# Patient Record
Sex: Female | Born: 1990 | Race: Black or African American | Hispanic: No | Marital: Single | Smoking: Never smoker
Health system: Southern US, Community
[De-identification: ages and names within clinical notes are randomized; demographics above are authoritative.]

## PROBLEM LIST (undated history)

## (undated) DIAGNOSIS — F32A Depression, unspecified: Secondary | ICD-10-CM

## (undated) DIAGNOSIS — J4530 Mild persistent asthma, uncomplicated: Secondary | ICD-10-CM

## (undated) DIAGNOSIS — T783XXA Angioneurotic edema, initial encounter: Secondary | ICD-10-CM

## (undated) HISTORY — DX: Angioneurotic edema, initial encounter: T78.3XXA

## (undated) HISTORY — DX: Mild persistent asthma, uncomplicated: J45.30

## (undated) HISTORY — PX: TONSILLECTOMY: SUR1361

## (undated) HISTORY — DX: Depression, unspecified: F32.A

## (undated) HISTORY — PX: TYMPANOSTOMY TUBE PLACEMENT: SHX32

---

## 2013-05-17 ENCOUNTER — Encounter (HOSPITAL_COMMUNITY): Payer: Self-pay | Admitting: Emergency Medicine

## 2013-05-17 ENCOUNTER — Emergency Department (HOSPITAL_COMMUNITY)
Admission: EM | Admit: 2013-05-17 | Discharge: 2013-05-17 | Discharge status: Against Medical Advice | Payer: BC Managed Care – PPO | Attending: Emergency Medicine | Admitting: Emergency Medicine

## 2013-05-17 DIAGNOSIS — R064 Hyperventilation: Secondary | ICD-10-CM | POA: Insufficient documentation

## 2013-05-17 DIAGNOSIS — S199XXA Unspecified injury of neck, initial encounter: Principal | ICD-10-CM

## 2013-05-17 DIAGNOSIS — Y9389 Activity, other specified: Secondary | ICD-10-CM | POA: Insufficient documentation

## 2013-05-17 DIAGNOSIS — S0993XA Unspecified injury of face, initial encounter: Secondary | ICD-10-CM | POA: Insufficient documentation

## 2013-05-17 DIAGNOSIS — Y92410 Unspecified street and highway as the place of occurrence of the external cause: Secondary | ICD-10-CM | POA: Insufficient documentation

## 2013-05-17 NOTE — ED Notes (Addendum)
Observed pt leaving Emergency room. Encouraged pt and family to stay. Explained to pt and family rooms where available and they were ready. Declined to stay
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
## 2016-09-03 ENCOUNTER — Ambulatory Visit (INDEPENDENT_AMBULATORY_CARE_PROVIDER_SITE_OTHER): Payer: 59 | Admitting: Pediatrics

## 2016-09-03 ENCOUNTER — Encounter: Payer: Self-pay | Admitting: Pediatrics

## 2016-09-03 VITALS — BP 122/76 | HR 87 | Temp 98.9°F | Resp 20 | Ht 67.13 in | Wt 168.0 lb

## 2016-09-03 DIAGNOSIS — J4530 Mild persistent asthma, uncomplicated: Secondary | ICD-10-CM | POA: Diagnosis not present

## 2016-09-03 DIAGNOSIS — T781XXD Other adverse food reactions, not elsewhere classified, subsequent encounter: Secondary | ICD-10-CM | POA: Diagnosis not present

## 2016-09-03 DIAGNOSIS — T7800XA Anaphylactic reaction due to unspecified food, initial encounter: Secondary | ICD-10-CM | POA: Insufficient documentation

## 2016-09-03 DIAGNOSIS — H1010 Acute atopic conjunctivitis, unspecified eye: Secondary | ICD-10-CM | POA: Insufficient documentation

## 2016-09-03 DIAGNOSIS — J301 Allergic rhinitis due to pollen: Secondary | ICD-10-CM | POA: Diagnosis not present

## 2016-09-03 DIAGNOSIS — H1045 Other chronic allergic conjunctivitis: Secondary | ICD-10-CM

## 2016-09-03 DIAGNOSIS — T7800XD Anaphylactic reaction due to unspecified food, subsequent encounter: Secondary | ICD-10-CM

## 2016-09-03 DIAGNOSIS — T781XXA Other adverse food reactions, not elsewhere classified, initial encounter: Secondary | ICD-10-CM | POA: Insufficient documentation

## 2016-09-03 HISTORY — DX: Mild persistent asthma, uncomplicated: J45.30

## 2016-09-03 MED ORDER — EPINEPHRINE 0.3 MG/0.3ML IJ SOAJ: INTRAMUSCULAR | 3 refills | Status: DC

## 2016-09-03 MED ORDER — FLUTICASONE PROPIONATE 50 MCG/ACT NA SUSP: NASAL | 5 refills | Status: DC

## 2016-09-03 MED ORDER — ALBUTEROL SULFATE (2.5 MG/3ML) 0.083% IN NEBU: INHALATION_SOLUTION | RESPIRATORY_TRACT | 2 refills | Status: DC

## 2016-09-03 MED ORDER — ALBUTEROL SULFATE HFA 108 (90 BASE) MCG/ACT IN AERS: INHALATION_SPRAY | RESPIRATORY_TRACT | 2 refills | Status: DC

## 2016-09-03 MED ORDER — MONTELUKAST SODIUM 10 MG PO TABS: ORAL_TABLET | ORAL | 5 refills | Status: DC

## 2016-09-03 NOTE — Patient Instructions (Addendum)
Environmental control of dust Zyrtec 10 mg once or twice a day if needed for runny nose and itchy eyes Fluticasone 2 sprays per nostril once a day for stuffy nose Zaditor 0.025% one drop in each eye 3 times a day to prevent allergies Naphcon-A one drop 3 times a day if needed for itchy eyes Montelukast 10 mg-take 1 tablet once a day for cough or wheeze Ventolin 2 puffs every 4 hours if needed for wheezing or coughing spells or instead albuterol 0.083% one unit dose every 4 hours if needed Add prednisone 20 mg twice a day for 3 days, 20 mg on the fourth day, 10 mg on the fifth day to bring your allergic symptoms under control Call me if you are not doing well on this treatment plan Allergy injections would be beneficial  Avoid peanuts, tree nuts, carrots, celery, apple, kiwi and peach. If you have an allergic reaction take Benadryl 50 mg every 4 hours and if you have life-threatening symptoms inject  with EpiPen 0.3 mg. Then write what you  had to eat or drink in the previous 4 hours

## 2016-09-03 NOTE — Progress Notes (Signed)
[address] [place] [address] Dept: [phone]  New Patient Note  Patient ID: Gina Harrison, female    DOB: [birth year]  Age: 26 y.o. MRN: [id number] Date of Office Visit: 09/03/2016 Referring provider: No referring provider defined for this encounter.    Chief Complaint: Shortness of Breath (wheezing and coughing x 2 months) and Allergic Rhinitis  (sneezing, runny eyes and nose, itchy throat, eyes, inside of mouth and ears)  HPI Aldeen C Little-Faison presents for evaluation of seasonal allergic rhinitis since childhood. Her symptoms are perennial but worse in the springtime. She has aggravation of her symptoms on exposure to dust. She has had itchy eyes. If she she eats carrots, celery's, apples, peaches and kiwi, she has an itchy throat. If she eats large amounts of peanuts or almond she has an itchy throat She has had some coughing wheezing and shortness of breath since childhood. These symptoms are worse with exercise. She has not had chronic hives or eczema  Review of Systems  Constitutional: Negative.   HENT:       Seasonal allergic rhinitis since childhood.-symptoms are perennial. Tonsillectomy  Eyes:       Itch in the spring  Respiratory:       Coughing , wheezing and shortness of breath since childhood. Some exercise-induced shortness of breath  Cardiovascular: Negative.   Gastrointestinal: Negative.   Genitourinary: Negative.   Musculoskeletal: Negative.   Skin:       Itchy rash on the face after eating certain fresh fruits and vegetables  Neurological: Negative.   Endo/Heme/Allergies:       No diabetes or thyroid disease  Psychiatric/Behavioral: Negative.     Outpatient Encounter Prescriptions as of 09/03/2016  Medication Sig  . albuterol (PROVENTIL HFA;VENTOLIN HFA) 108 (90 Base) MCG/ACT inhaler 2 puffs every 4 hours as needed for coughing or wheezing spells  . albuterol (PROVENTIL) (2.5 MG/3ML) 0.083% nebulizer solution One ampule in nebulizer  every 4-6 hours as needed for cough or wheeze  . EPINEPHrine (EPIPEN 2-PAK) 0.3 mg/0.3 mL IJ SOAJ injection Use as directed for severe allergic reactions  . fluticasone (FLONASE) 50 MCG/ACT nasal spray 2 sprays in each nostril once a day for stuffy nose  . montelukast (SINGULAIR) 10 MG tablet One tablet once a day for cough or wheeze   No facility-administered encounter medications on file as of 09/03/2016.      Drug Allergies:  No Known Allergies  Family History: Ashari's family history includes Allergic rhinitis in her maternal grandfather... Family history is negative for asthma, sinus problems, eczema, hives, food allergies, chronic bronchitis or emphysema.  Social and environmental. There are no pets in the home. She is not exposed to cigarette smoking. She does not smoke cigarettes. She is a Pharmacologist  Physical Exam: BP 122/76   Pulse 87   Temp 98.9 F (37.2 C) (Oral)   Resp 20   Ht 5' 7.13" (1.705 m)   Wt 167 lb 15.9 oz (76.2 kg)   SpO2 98%   BMI 26.21 kg/m    Physical Exam  Constitutional: She is oriented to person, place, and time. She appears well-developed and well-nourished.  HENT:  Eyes showed mild erythema of the palpebral conjunctiva. Ears normal. Nose moderate swelling of nasal turbinates with clear nasal discharge. Pharynx normal.  Neck: Neck supple. No thyromegaly present.  Cardiovascular:  S1 and S2 normal no murmurs  Pulmonary/Chest:  Clear to percussion and auscultation  Abdominal: Soft. There is no tenderness (no hepatosplenomegaly).  Lymphadenopathy:  She has no cervical adenopathy.  Neurological: She is alert and oriented to person, place, and time.  Skin:  Clear  Psychiatric: She has a normal mood and affect. Her behavior is normal. Judgment and thought content normal.  Vitals reviewed.   Diagnostics: FVC 3.84 L FEV1 2.83 L. Predicted FVC 3.57 L predicted FEV1 3.04 L. After albuterol 2 puffs FVC 3.98 L FEV1 3.01 L-the spirometry is  in the normal range and there was no significant improvement after albuterol  Allergy skin tests were extremely positive to grass pollens, weeds, tree pollens, molds. Slight reactions noted to cat. She also had positive skin tests to peanut, cashew, apple, peach, carrots, celery, almond, hazelnut   Assessment  Assessment and Plan: 1. Mild persistent asthma without complication   2. Seasonal allergic rhinitis due to pollen   3. Anaphylactic shock due to food, subsequent encounter   4. Pollen-food allergy, subsequent encounter   5. Seasonal allergic conjunctivitis     Meds ordered this encounter  Medications  . fluticasone (FLONASE) 50 MCG/ACT nasal spray    Sig: 2 sprays in each nostril once a day for stuffy nose    Dispense:  16 g    Refill:  5  . montelukast (SINGULAIR) 10 MG tablet    Sig: One tablet once a day for cough or wheeze    Dispense:  34 tablet    Refill:  5  . EPINEPHrine (EPIPEN 2-PAK) 0.3 mg/0.3 mL IJ SOAJ injection    Sig: Use as directed for severe allergic reactions    Dispense:  4 Device    Refill:  3  . albuterol (PROVENTIL) (2.5 MG/3ML) 0.083% nebulizer solution    Sig: One ampule in nebulizer every 4-6 hours as needed for cough or wheeze    Dispense:  75 mL    Refill:  2  . albuterol (PROVENTIL HFA;VENTOLIN HFA) 108 (90 Base) MCG/ACT inhaler    Sig: 2 puffs every 4 hours as needed for coughing or wheezing spells    Dispense:  1 Inhaler    Refill:  2    Patient Instructions  Environmental control of dust Zyrtec 10 mg once or twice a day if needed for runny nose and itchy eyes Fluticasone 2 sprays per nostril once a day for stuffy nose Zaditor 0.025% one drop in each eye 3 times a day to prevent allergies Naphcon-A one drop 3 times a day if needed for itchy eyes Montelukast 10 mg-take 1 tablet once a day for cough or wheeze Ventolin 2 puffs every 4 hours if needed for wheezing or coughing spells or instead albuterol 0.083% one unit dose every 4 hours  if needed Add prednisone 20 mg twice a day for 3 days, 20 mg on the fourth day, 10 mg on the fifth day to bring your allergic symptoms under control Call me if you are not doing well on this treatment plan Allergy injections would be beneficial  Avoid peanuts, tree nuts, carrots, celery, apple, kiwi and peach. If you have an allergic reaction take Benadryl 50 mg every 4 hours and if you have life-threatening symptoms inject  with EpiPen 0.3 mg. Then write what you  had to eat or drink in the previous 4 hours   Return in about 4 weeks (around 10/01/2016).   Thank you for the opportunity to care for this patient.  Please do not hesitate to contact me with questions.  Tonette BihariJ. A. Bardelas, M.D.  [facility] [address]  [place], [place] [address] [phone]

## 2016-10-01 ENCOUNTER — Ambulatory Visit (INDEPENDENT_AMBULATORY_CARE_PROVIDER_SITE_OTHER): Payer: 59 | Admitting: Pediatrics

## 2016-10-01 ENCOUNTER — Encounter: Payer: Self-pay | Admitting: Pediatrics

## 2016-10-01 VITALS — BP 106/64 | HR 78 | Temp 98.7°F | Resp 16

## 2016-10-01 DIAGNOSIS — J301 Allergic rhinitis due to pollen: Secondary | ICD-10-CM

## 2016-10-01 DIAGNOSIS — T781XXD Other adverse food reactions, not elsewhere classified, subsequent encounter: Secondary | ICD-10-CM | POA: Diagnosis not present

## 2016-10-01 DIAGNOSIS — H1045 Other chronic allergic conjunctivitis: Secondary | ICD-10-CM | POA: Diagnosis not present

## 2016-10-01 DIAGNOSIS — J4530 Mild persistent asthma, uncomplicated: Secondary | ICD-10-CM

## 2016-10-01 DIAGNOSIS — T7800XD Anaphylactic reaction due to unspecified food, subsequent encounter: Secondary | ICD-10-CM

## 2016-10-01 DIAGNOSIS — H1010 Acute atopic conjunctivitis, unspecified eye: Secondary | ICD-10-CM

## 2016-10-01 NOTE — Progress Notes (Signed)
  [address] [place] [address] Dept: [phone]  FOLLOW UP NOTE  Patient ID: Gina MemosDysiree C Harrison, female    DOB: 05-15-1990  Age: 26 y.o. MRN: [id number] Date of Office Visit: 10/01/2016  Assessment  Chief Complaint: Allergies  HPI Gina Harrison presents for follow-up of allergic rhinitis and mild asthma. Her symptoms are much improved since the last visit. She is not having to use Ventolin. Her nasal congestion is much improved. She continues to avoid peanuts, tree nuts, carrots, celery, apple, Kiwi and peach.  Current medications are Zyrtec 10 mg once a day, fluticasone 2 sprays per nostril once a day, Zaditor 0.025% one drop in each eye 3 times a day, Naphcon-A one drop 3 times a day if needed, montelukast 10 mg once a day, Ventolin 2 puffs every 4 hours if needed or instead albuterol 0.083% one unit dose every 4 hours if needed and Benadryl and EpiPen in case of an allergic reaction.   Drug Allergies:  Allergies  Allergen Reactions  . Apple   . Carrot Oil   . Celery Oil   . Kiwi Extract   . Peach [Prunus Persica]   . Peanut-Containing Drug Products     Tree nuts    Physical Exam: BP 106/64   Pulse 78   Temp 98.7 F (37.1 C) (Oral)   Resp 16   SpO2 99%    Physical Exam  Constitutional: She is oriented to person, place, and time. She appears well-developed and well-nourished.  HENT:  Eyes normal. Ears normal. Nose normal. Pharynx normal.  Neck: Neck supple.  Cardiovascular:  S1 and S2 normal no murmurs  Pulmonary/Chest:  Clear to percussion and auscultation  Lymphadenopathy:    She has no cervical adenopathy.  Neurological: She is alert and oriented to person, place, and time.  Psychiatric: She has a normal mood and affect. Her behavior is normal. Judgment and thought content normal.  Vitals reviewed.   Diagnostics:  FVC 3.81 L FEV1 2.94 L. Predicted FVC 3.57 L predicted FEV1 3.04 L-the spirometry is in the normal range  Assessment  and Plan: 1. Mild persistent asthma without complication   2. Anaphylactic shock due to food, subsequent encounter   3. Pollen-food allergy, subsequent encounter   4. Seasonal allergic rhinitis due to pollen   5. Seasonal allergic conjunctivitis      Patient Instructions  Continue on her current medications In the end of June she may use Zyrtec, fluticasone and montelukast  if needed. She may be able to get by without  medications until the next allergy season Continue on her diet elimination and having Benadryl and EpiPen 0.3 mg available Call me if you are not doing well on this treatment plan   Return in about 1 year (around 10/01/2017).    Thank you for the opportunity to care for this patient.  Please do not hesitate to contact me with questions.  Tonette BihariJ. A. Ami Mally, M.D.  [facility] [address] [place], [place] [address] [phone]

## 2016-10-01 NOTE — Patient Instructions (Addendum)
Continue on her current medications In the end of June she may use Zyrtec, fluticasone and montelukast  if needed. She may be able to get by without  medications until the next allergy season Continue on her diet elimination and having Benadryl and EpiPen 0.3 mg available Call me if you are not doing well on this treatment plan

## 2017-12-26 ENCOUNTER — Emergency Department (HOSPITAL_BASED_OUTPATIENT_CLINIC_OR_DEPARTMENT_OTHER)
Admission: EM | Admit: 2017-12-26 | Discharge: 2017-12-26 | Disposition: A | Discharge status: Home / Self Care | Payer: 59 | Attending: Emergency Medicine | Admitting: Emergency Medicine

## 2017-12-26 ENCOUNTER — Encounter (HOSPITAL_BASED_OUTPATIENT_CLINIC_OR_DEPARTMENT_OTHER): Payer: Self-pay

## 2017-12-26 ENCOUNTER — Other Ambulatory Visit: Payer: Self-pay

## 2017-12-26 DIAGNOSIS — R51 Headache: Secondary | ICD-10-CM | POA: Insufficient documentation

## 2017-12-26 DIAGNOSIS — Y9389 Activity, other specified: Secondary | ICD-10-CM | POA: Insufficient documentation

## 2017-12-26 DIAGNOSIS — S161XXA Strain of muscle, fascia and tendon at neck level, initial encounter: Secondary | ICD-10-CM

## 2017-12-26 DIAGNOSIS — Z79899 Other long term (current) drug therapy: Secondary | ICD-10-CM | POA: Diagnosis not present

## 2017-12-26 DIAGNOSIS — Y999 Unspecified external cause status: Secondary | ICD-10-CM | POA: Insufficient documentation

## 2017-12-26 DIAGNOSIS — J4530 Mild persistent asthma, uncomplicated: Secondary | ICD-10-CM | POA: Insufficient documentation

## 2017-12-26 DIAGNOSIS — R519 Headache, unspecified: Secondary | ICD-10-CM

## 2017-12-26 DIAGNOSIS — S199XXA Unspecified injury of neck, initial encounter: Secondary | ICD-10-CM | POA: Diagnosis present

## 2017-12-26 DIAGNOSIS — Y92410 Unspecified street and highway as the place of occurrence of the external cause: Secondary | ICD-10-CM | POA: Diagnosis not present

## 2017-12-26 MED ORDER — MELOXICAM 7.5 MG PO TABS: 7.5000 mg | ORAL_TABLET | Freq: Every day | ORAL | 0 refills | Status: DC

## 2017-12-26 MED ORDER — METHOCARBAMOL 500 MG PO TABS: 500.0000 mg | ORAL_TABLET | Freq: Two times a day (BID) | ORAL | 0 refills | Status: DC

## 2017-12-26 MED ORDER — KETOROLAC TROMETHAMINE 30 MG/ML IJ SOLN
30.0000 mg | Freq: Once | INTRAMUSCULAR | Status: AC
  Administered 2017-12-26: 30 mg via INTRAMUSCULAR
  Filled 2017-12-26: qty 1

## 2017-12-26 MED ORDER — METOCLOPRAMIDE HCL 10 MG PO TABS
10.0000 mg | ORAL_TABLET | Freq: Once | ORAL | Status: AC
  Administered 2017-12-26: 10 mg via ORAL
  Filled 2017-12-26: qty 1
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
## 2018-02-13 ENCOUNTER — Ambulatory Visit: Payer: 59

## 2018-02-13 ENCOUNTER — Ambulatory Visit (INDEPENDENT_AMBULATORY_CARE_PROVIDER_SITE_OTHER): Payer: 59

## 2018-02-13 DIAGNOSIS — N912 Amenorrhea, unspecified: Secondary | ICD-10-CM

## 2018-02-13 DIAGNOSIS — Z3201 Encounter for pregnancy test, result positive: Secondary | ICD-10-CM | POA: Diagnosis not present

## 2018-02-13 MED ORDER — VITAFOL GUMMIES 3.33-0.333-34.8 MG PO CHEW: 3.0000 | CHEWABLE_TABLET | Freq: Every day | ORAL | 12 refills | Status: DC

## 2018-02-13 NOTE — Progress Notes (Addendum)
Gina Harrison presents today for UPT. She complains of nausea and vomiting.  LMP: 12/10/17    OBJECTIVE: Appears well, in no apparent distress.  OB History    Gravida  1   Para      Term      Preterm      AB      Living        SAB      TAB      Ectopic      Multiple      Live Births             Home UPT Result: pos In-Office UPT result:pos I have reviewed the patient's medical, obstetrical, social, and family histories, and medications.   ASSESSMENT: Positive pregnancy test  PLAN Prenatal care to be completed at:  [facility]  Pt to start PNV's. Samples of Bonjesta given   I have reviewed the plan of care and agree with the documentation. Nolene Bernheim, RN, MSN, NP-BC Nurse Practitioner, [facility] for [employer], [facility] 02/13/2018 11:43 AM

## 2018-03-23 ENCOUNTER — Other Ambulatory Visit (HOSPITAL_COMMUNITY)
Admission: RE | Admit: 2018-03-23 | Discharge: 2018-03-23 | Disposition: A | Discharge status: Home / Self Care | Payer: 59 | Source: Ambulatory Visit | Attending: Advanced Practice Midwife | Admitting: Advanced Practice Midwife

## 2018-03-23 ENCOUNTER — Ambulatory Visit (INDEPENDENT_AMBULATORY_CARE_PROVIDER_SITE_OTHER): Payer: 59 | Admitting: Advanced Practice Midwife

## 2018-03-23 ENCOUNTER — Encounter: Payer: Self-pay | Admitting: Advanced Practice Midwife

## 2018-03-23 VITALS — BP 119/79 | HR 88 | Wt 185.0 lb

## 2018-03-23 DIAGNOSIS — Z3401 Encounter for supervision of normal first pregnancy, first trimester: Secondary | ICD-10-CM | POA: Insufficient documentation

## 2018-03-23 DIAGNOSIS — Z3481 Encounter for supervision of other normal pregnancy, first trimester: Secondary | ICD-10-CM | POA: Diagnosis not present

## 2018-03-23 DIAGNOSIS — Z3400 Encounter for supervision of normal first pregnancy, unspecified trimester: Secondary | ICD-10-CM | POA: Insufficient documentation

## 2018-03-23 DIAGNOSIS — T7800XA Anaphylactic reaction due to unspecified food, initial encounter: Secondary | ICD-10-CM

## 2018-03-23 DIAGNOSIS — J4530 Mild persistent asthma, uncomplicated: Secondary | ICD-10-CM

## 2018-03-23 MED ORDER — EPINEPHRINE 0.3 MG/0.3ML IJ SOAJ: INTRAMUSCULAR | 1 refills | Status: DC

## 2018-03-23 MED ORDER — ALBUTEROL SULFATE HFA 108 (90 BASE) MCG/ACT IN AERS: INHALATION_SPRAY | RESPIRATORY_TRACT | 5 refills | Status: DC

## 2018-03-23 NOTE — Progress Notes (Signed)
Gen

## 2018-03-23 NOTE — Addendum Note (Signed)
Addended by: Areta HaberMOREHEAD, Angelamarie Avakian B on: 03/23/2018 04:58 PM   Modules accepted: Orders

## 2018-03-23 NOTE — Progress Notes (Signed)
   PRENATAL VISIT NOTE  Subjective:  Gina Harrison is a 27 y.o. G1P0 at 4987w2d being seen today for initial prenatal visit.  She is currently monitored for the following issues for this low-risk pregnancy and has Mild persistent asthma without complication; Seasonal allergic rhinitis due to pollen; Anaphylactic shock due to adverse food reaction; Pollen-food allergy; Seasonal allergic conjunctivitis; and Encounter for supervision of normal first pregnancy in first trimester on their problem list.  Patient reports nausea.  Contractions: Not present. Vag. Bleeding: None.  Movement: Absent. Denies leaking of fluid.   The following portions of the patient's history were reviewed and updated as appropriate: allergies, current medications, past family history, past medical history, past social history, past surgical history and problem list. Problem list updated.  Objective:   Vitals:   03/23/18 1052  BP: 119/79  Pulse: 88  Weight: 83.9 kg    Fetal Status:     Movement: Absent     VS reviewed, nursing note reviewed,  Constitutional: well developed, well nourished, no distress HEENT: normocephalic CV: normal rate Pulm/chest wall: normal effort Breast Exam:  Deferred Abdomen: soft Neuro: alert and oriented x 3 Skin: warm, dry Psych: affect normal Pelvic exam: Cervix pink, visually closed, without lesion, scant white creamy discharge, vaginal walls and external genitalia normal Bimanual exam: Cervix 0/long/high, firm, anterior, neg CMT, uterus nontender, nonenlarged, adnexa without tenderness, enlargement, or mass Assessment and Plan:  Pregnancy: G1P0 at 4587w2d  1. Encounter for supervision of normal first pregnancy in first trimester --Discussed and offered genetic screening options, including Quad screen/AFP, NIPS testing, and option to decline testing. Benefits/risks/alternatives reviewed. Pt aware that anatomy US is form of genetic screening with lower accuracy in detecting  trisomies than blood work.  Pt chooses NIPS for genetic screening today. --Anticipatory guidance about next visits/weeks of pregnancy given. --Discussed our practice, [facility] office, MAU and reasons to go, teaching service with residents/students, and providers including CNMs, FP MDs and OB/Gyns.  - Culture, OB Urine - Cystic Fibrosis Mutation 97 - Hemoglobinopathy evaluation - Obstetric Panel, Including HIV - SMN1 Copy Number Analysis - Cervicovaginal ancillary only( [place]) - Cytology - PAP( [place]) - Genetic Screening  Preterm labor symptoms and general obstetric precautions including but not limited to vaginal bleeding, contractions, leaking of fluid and fetal movement were reviewed in detail with the patient. Please refer to After Visit Summary for other counseling recommendations.  No follow-ups on file.  Future Appointments  Date Time Provider Department Center  04/20/2018  3:00 PM Leftwich-Kirby, Wilmer FloorLisa A, CNM [facility] None    Sharen CounterLisa Leftwich-Kirby, CNM

## 2018-03-24 ENCOUNTER — Other Ambulatory Visit: Payer: Self-pay | Admitting: Advanced Practice Midwife

## 2018-03-24 ENCOUNTER — Telehealth: Payer: Self-pay

## 2018-03-24 MED ORDER — DOXYLAMINE-PYRIDOXINE 10-10 MG PO TBEC: DELAYED_RELEASE_TABLET | ORAL | 5 refills | Status: DC

## 2018-03-24 MED ORDER — METOCLOPRAMIDE HCL 10 MG PO TABS: 10.0000 mg | ORAL_TABLET | Freq: Three times a day (TID) | ORAL | 2 refills | Status: DC

## 2018-03-24 NOTE — Telephone Encounter (Signed)
Patient called and states that she received the rx for the inhaler and the epipen, but she states that she asked for an rx for her nausea as well and did not receive that yet.

## 2018-03-25 LAB — OBSTETRIC PANEL, INCLUDING HIV
Antibody Screen: NEGATIVE
Basophils Absolute: 0 10*3/uL (ref 0.0–0.2)
Basos: 0 %
EOS (ABSOLUTE): 0 10*3/uL (ref 0.0–0.4)
Eos: 0 %
HEMOGLOBIN: 11.8 g/dL (ref 11.1–15.9)
HEP B S AG: NEGATIVE
HIV Screen 4th Generation wRfx: NONREACTIVE
Hematocrit: 34.6 % (ref 34.0–46.6)
IMMATURE GRANULOCYTES: 0 %
Immature Grans (Abs): 0 10*3/uL (ref 0.0–0.1)
LYMPHS ABS: 1.5 10*3/uL (ref 0.7–3.1)
Lymphs: 18 %
MCH: 30.3 pg (ref 26.6–33.0)
MCHC: 34.1 g/dL (ref 31.5–35.7)
MCV: 89 fL (ref 79–97)
MONOCYTES: 7 %
MONOS ABS: 0.6 10*3/uL (ref 0.1–0.9)
Neutrophils Absolute: 6.4 10*3/uL (ref 1.4–7.0)
Neutrophils: 75 %
Platelets: 252 10*3/uL (ref 150–450)
RBC: 3.89 x10E6/uL (ref 3.77–5.28)
RDW: 12.6 % (ref 12.3–15.4)
RPR Ser Ql: NONREACTIVE
Rh Factor: POSITIVE
Rubella Antibodies, IGG: 11.4 index (ref >0.99)
WBC: 8.5 10*3/uL (ref 3.4–10.8)

## 2018-03-25 LAB — CERVICOVAGINAL ANCILLARY ONLY
BACTERIAL VAGINITIS: POSITIVE — AB
Candida vaginitis: NEGATIVE
TRICH (WINDOWPATH): NEGATIVE

## 2018-03-25 LAB — HEMOGLOBINOPATHY EVALUATION
HEMOGLOBIN F QUANTITATION: 0 % (ref 0.0–2.0)
HGB C: 0 %
HGB S: 0 %
HGB VARIANT: 0 %
Hemoglobin A2 Quantitation: 2.2 % (ref 1.8–3.2)
Hgb A: 97.8 % (ref 96.4–98.8)

## 2018-03-25 LAB — CYTOLOGY - PAP
Chlamydia: NEGATIVE
Diagnosis: NEGATIVE
NEISSERIA GONORRHEA: NEGATIVE

## 2018-03-25 LAB — CULTURE, OB URINE

## 2018-03-25 LAB — URINE CULTURE, OB REFLEX

## 2018-03-27 ENCOUNTER — Encounter: Payer: Self-pay | Admitting: Advanced Practice Midwife

## 2018-03-30 ENCOUNTER — Other Ambulatory Visit: Payer: Self-pay

## 2018-03-30 MED ORDER — METRONIDAZOLE 500 MG PO TABS: 500.0000 mg | ORAL_TABLET | Freq: Two times a day (BID) | ORAL | 0 refills | Status: DC

## 2018-03-31 ENCOUNTER — Telehealth: Payer: Self-pay

## 2018-03-31 LAB — SMN1 COPY NUMBER ANALYSIS (SMA CARRIER SCREENING)

## 2018-03-31 LAB — CYSTIC FIBROSIS MUTATION 97: Interpretation: NOT DETECTED

## 2018-03-31 NOTE — Telephone Encounter (Signed)
Advised of results and rx sent 

## 2018-03-31 NOTE — Telephone Encounter (Signed)
Attempted to contact about results and rx sent, no answer, left vm. 

## 2018-04-01 ENCOUNTER — Encounter: Payer: Self-pay | Admitting: Advanced Practice Midwife

## 2018-04-20 ENCOUNTER — Ambulatory Visit (INDEPENDENT_AMBULATORY_CARE_PROVIDER_SITE_OTHER): Payer: 59 | Admitting: Advanced Practice Midwife

## 2018-04-20 VITALS — BP 108/75 | HR 80 | Wt 185.0 lb

## 2018-04-20 DIAGNOSIS — Z3401 Encounter for supervision of normal first pregnancy, first trimester: Secondary | ICD-10-CM

## 2018-04-20 DIAGNOSIS — Z3402 Encounter for supervision of normal first pregnancy, second trimester: Secondary | ICD-10-CM

## 2018-04-20 NOTE — Patient Instructions (Signed)

## 2018-04-20 NOTE — Progress Notes (Signed)
   PRENATAL VISIT NOTE  Subjective:  Gina Harrison is a 27 y.o. G1P0 at 3944w2d being seen today for ongoing prenatal care.  She is currently monitored for the following issues for this low-risk pregnancy and has Mild persistent asthma without complication; Seasonal allergic rhinitis due to pollen; Anaphylactic shock due to adverse food reaction; Pollen-food allergy; Seasonal allergic conjunctivitis; and Encounter for supervision of normal first pregnancy in first trimester on their problem list.  Patient reports no complaints.  Contractions: Not present. Vag. Bleeding: None.   . Denies leaking of fluid.   The following portions of the patient's history were reviewed and updated as appropriate: allergies, current medications, past family history, past medical history, past social history, past surgical history and problem list. Problem list updated.  Objective:   Vitals:   04/20/18 1459  BP: 108/75  Pulse: 80  Weight: 83.9 kg    Fetal Status: Fetal Heart Rate (bpm): 149         General:  Alert, oriented and cooperative. Patient is in no acute distress.  Skin: Skin is warm and dry. No rash noted.   Cardiovascular: Normal heart rate noted  Respiratory: Normal respiratory effort, no problems with respiration noted  Abdomen: Soft, gravid, appropriate for gestational age.  Pain/Pressure: Absent     Pelvic: Cervical exam deferred        Extremities: Normal range of motion.  Edema: None  Mental Status: Normal mood and affect. Normal behavior. Normal judgment and thought content.   Assessment and Plan:  Pregnancy: G1P0 at 5344w2d  1. Encounter for supervision of normal first pregnancy in first trimester --Anticipatory guidance about next visits/weeks of pregnancy given. --Anatomy US ordered   Preterm labor symptoms and general obstetric precautions including but not limited to vaginal bleeding, contractions, leaking of fluid and fetal movement were reviewed in detail with the  patient. Please refer to After Visit Summary for other counseling recommendations.  No follow-ups on file.  No future appointments.  Sharen CounterLisa Leftwich-Kirby, CNM

## 2018-04-20 NOTE — Progress Notes (Signed)
Patient states she has not started feeling fetal movement yet, denies pain. 

## 2018-04-29 NOTE — L&D Delivery Note (Signed)
   Delivery Note Called to attend this delivery dt CNM being tied up in surgery with another patient.  After a 20 minute or so 2nd stage, at 2:40 PM a viable female was delivered via Vaginal, Spontaneous (Presentation:ROA ).  APGAR: 6/9; weight pending at this time. The shoulders were not forthcoming, so the posterior (right) axilla was grasped with my index finger, and the baby was rotated clockwise into the oblique diameter.  At this point, the (now) anterior shoulder was released, and the baby delivered.  At no time was any traction placed on the baby's head. The total time was 57 seconds from head to delivery.  However, it is in my clinical opinion that this patient should not attempt a vaginal birth again if the baby is estimated to weigh more than this baby.  After about 30-45 seconds (d/t poor tone/color), the cord was clamped and cut. 40 units of pitocin diluted in 1000cc LR was infused rapidly IV.  The placenta separated spontaneously and delivered via CCT and maternal pushing effort.  It was inspected and appears to be intact with a 3 VC.    Anesthesia:  Epidural/local Episiotomy: None Lacerations:  2nd degree vaginal/left sulcus Suture Repair: 2.0 vicryl Est. Blood Loss (mL): 629  Mom to postpartum.  Baby to Couplet care / Skin to Skin.  Joaquim Lai Cresenzo-Dishmon 10/25/2018, 3:22 PM

## 2018-05-11 ENCOUNTER — Telehealth: Payer: Self-pay | Admitting: Advanced Practice Midwife

## 2018-05-11 ENCOUNTER — Telehealth: Payer: Self-pay

## 2018-05-11 DIAGNOSIS — O26892 Other specified pregnancy related conditions, second trimester: Secondary | ICD-10-CM

## 2018-05-11 DIAGNOSIS — R51 Headache: Principal | ICD-10-CM

## 2018-05-11 MED ORDER — BUTALBITAL-APAP-CAFFEINE 50-325-40 MG PO TABS: 1.0000 | ORAL_TABLET | Freq: Four times a day (QID) | ORAL | 0 refills | Status: DC | PRN
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
## 2018-05-18 ENCOUNTER — Encounter (HOSPITAL_COMMUNITY): Payer: Self-pay

## 2018-05-20 ENCOUNTER — Encounter: Payer: 59 | Admitting: Obstetrics & Gynecology

## 2018-05-25 ENCOUNTER — Ambulatory Visit (INDEPENDENT_AMBULATORY_CARE_PROVIDER_SITE_OTHER): Payer: 59 | Admitting: Obstetrics and Gynecology

## 2018-05-25 ENCOUNTER — Ambulatory Visit (HOSPITAL_COMMUNITY)
Admission: RE | Admit: 2018-05-25 | Discharge: 2018-05-25 | Disposition: A | Discharge status: Home / Self Care | Payer: 59 | Source: Ambulatory Visit | Attending: Advanced Practice Midwife | Admitting: Advanced Practice Midwife

## 2018-05-25 ENCOUNTER — Encounter: Payer: Self-pay | Admitting: Obstetrics and Gynecology

## 2018-05-25 VITALS — BP 126/74 | HR 106 | Wt 196.0 lb

## 2018-05-25 DIAGNOSIS — Z363 Encounter for antenatal screening for malformations: Secondary | ICD-10-CM

## 2018-05-25 DIAGNOSIS — Z3401 Encounter for supervision of normal first pregnancy, first trimester: Secondary | ICD-10-CM

## 2018-05-25 DIAGNOSIS — R51 Headache: Secondary | ICD-10-CM

## 2018-05-25 DIAGNOSIS — Z3A19 19 weeks gestation of pregnancy: Secondary | ICD-10-CM

## 2018-05-25 DIAGNOSIS — R519 Headache, unspecified: Secondary | ICD-10-CM

## 2018-05-25 DIAGNOSIS — Z3402 Encounter for supervision of normal first pregnancy, second trimester: Secondary | ICD-10-CM
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
## 2018-05-26 ENCOUNTER — Other Ambulatory Visit (HOSPITAL_COMMUNITY): Payer: Self-pay | Admitting: *Deleted

## 2018-05-26 DIAGNOSIS — O358XX0 Maternal care for other (suspected) fetal abnormality and damage, not applicable or unspecified: Secondary | ICD-10-CM

## 2018-05-27 ENCOUNTER — Other Ambulatory Visit: Payer: Self-pay | Admitting: Obstetrics and Gynecology

## 2018-05-27 DIAGNOSIS — Z3401 Encounter for supervision of normal first pregnancy, first trimester: Secondary | ICD-10-CM

## 2018-06-22 ENCOUNTER — Encounter: Payer: 59 | Admitting: Obstetrics and Gynecology

## 2018-06-23 ENCOUNTER — Encounter (HOSPITAL_COMMUNITY): Payer: Self-pay

## 2018-06-23 ENCOUNTER — Ambulatory Visit (HOSPITAL_COMMUNITY)
Admission: RE | Admit: 2018-06-23 | Discharge: 2018-06-23 | Disposition: A | Discharge status: Home / Self Care | Payer: 59 | Source: Ambulatory Visit | Attending: Obstetrics and Gynecology | Admitting: Obstetrics and Gynecology

## 2018-06-23 ENCOUNTER — Ambulatory Visit (HOSPITAL_COMMUNITY): Payer: 59 | Admitting: *Deleted

## 2018-06-23 ENCOUNTER — Ambulatory Visit (INDEPENDENT_AMBULATORY_CARE_PROVIDER_SITE_OTHER): Payer: 59 | Admitting: Obstetrics and Gynecology

## 2018-06-23 ENCOUNTER — Encounter: Payer: Self-pay | Admitting: Obstetrics and Gynecology

## 2018-06-23 VITALS — BP 109/63 | HR 82 | Wt 195.2 lb

## 2018-06-23 VITALS — BP 119/77 | HR 94 | Wt 195.6 lb

## 2018-06-23 DIAGNOSIS — Z362 Encounter for other antenatal screening follow-up: Secondary | ICD-10-CM

## 2018-06-23 DIAGNOSIS — O358XX0 Maternal care for other (suspected) fetal abnormality and damage, not applicable or unspecified: Secondary | ICD-10-CM | POA: Diagnosis present

## 2018-06-23 DIAGNOSIS — Z3400 Encounter for supervision of normal first pregnancy, unspecified trimester: Secondary | ICD-10-CM

## 2018-06-23 DIAGNOSIS — Z3A23 23 weeks gestation of pregnancy: Secondary | ICD-10-CM | POA: Diagnosis not present

## 2018-06-23 DIAGNOSIS — Z3402 Encounter for supervision of normal first pregnancy, second trimester: Secondary | ICD-10-CM

## 2018-06-23 NOTE — Progress Notes (Signed)
   PRENATAL VISIT NOTE  Subjective:  Gina Harrison is a 28 y.o. G1P0 at [redacted]w[redacted]d being seen today for ongoing prenatal care.  She is currently monitored for the following issues for this low-risk pregnancy and has Mild persistent asthma without complication; Seasonal allergic rhinitis due to pollen; Anaphylactic shock due to adverse food reaction; Pollen-food allergy; Seasonal allergic conjunctivitis; and Encounter for supervision of low-risk first pregnancy, antepartum on their problem list.  Patient reports no complaints.  Contractions: Not present. Vag. Bleeding: None.  Movement: Present. Denies leaking of fluid.   The following portions of the patient's history were reviewed and updated as appropriate: allergies, current medications, past family history, past medical history, past social history, past surgical history and problem list. Problem list updated.  Objective:   Vitals:   06/23/18 1406  BP: 119/77  Pulse: 94  Weight: 195 lb 9.6 oz (88.7 kg)    Fetal Status: Fetal Heart Rate (bpm): 142 Fundal Height: 25 cm Movement: Present     General:  Alert, oriented and cooperative. Patient is in no acute distress.  Skin: Skin is warm and dry. No rash noted.   Cardiovascular: Normal heart rate noted  Respiratory: Normal respiratory effort, no problems with respiration noted  Abdomen: Soft, gravid, appropriate for gestational age.  Pain/Pressure: Absent     Pelvic: Cervical exam deferred        Extremities: Normal range of motion.  Edema: Trace  Mental Status: Normal mood and affect. Normal behavior. Normal judgment and thought content.   Assessment and Plan:  Pregnancy: G1P0 at [redacted]w[redacted]d  1. Encounter for supervision of low-risk first pregnancy, antepartum Patient is doing well without complaints Third trimester labs next visit Patient is researching pediatrician  Preterm labor symptoms and general obstetric precautions including but not limited to vaginal bleeding,  contractions, leaking of fluid and fetal movement were reviewed in detail with the patient. Please refer to After Visit Summary for other counseling recommendations.  Return in about 4 weeks (around 07/21/2018) for ROB, 2 hr glucola next visit.  Future Appointments  Date Time Provider Department Center  06/23/2018  3:20 PM [facility] NURSE [facility]  06/23/2018  3:30 PM [facility] [place] 1 [facility]    Catalina Antigua, MD

## 2018-06-23 NOTE — Progress Notes (Signed)
Pt presents for ROB without complaints today.  

## 2018-06-26 ENCOUNTER — Other Ambulatory Visit (HOSPITAL_COMMUNITY): Payer: Self-pay | Admitting: *Deleted

## 2018-06-26 DIAGNOSIS — Z362 Encounter for other antenatal screening follow-up: Secondary | ICD-10-CM

## 2018-06-26 NOTE — Progress Notes (Unsigned)
.  mfm

## 2018-07-21 ENCOUNTER — Encounter: Payer: Self-pay | Admitting: Obstetrics and Gynecology

## 2018-07-21 ENCOUNTER — Encounter (HOSPITAL_COMMUNITY): Payer: Self-pay

## 2018-07-21 ENCOUNTER — Ambulatory Visit (INDEPENDENT_AMBULATORY_CARE_PROVIDER_SITE_OTHER): Payer: 59 | Admitting: Obstetrics and Gynecology

## 2018-07-21 ENCOUNTER — Other Ambulatory Visit: Payer: 59

## 2018-07-21 ENCOUNTER — Ambulatory Visit (HOSPITAL_COMMUNITY): Payer: 59 | Admitting: *Deleted

## 2018-07-21 ENCOUNTER — Other Ambulatory Visit: Payer: Self-pay

## 2018-07-21 ENCOUNTER — Ambulatory Visit (HOSPITAL_COMMUNITY)
Admission: RE | Admit: 2018-07-21 | Discharge: 2018-07-21 | Disposition: A | Discharge status: Home / Self Care | Payer: 59 | Source: Ambulatory Visit | Attending: Obstetrics and Gynecology | Admitting: Obstetrics and Gynecology

## 2018-07-21 VITALS — BP 122/67 | HR 92 | Temp 98.4°F

## 2018-07-21 VITALS — BP 116/76 | HR 101 | Wt 200.5 lb

## 2018-07-21 DIAGNOSIS — Z362 Encounter for other antenatal screening follow-up: Secondary | ICD-10-CM | POA: Insufficient documentation

## 2018-07-21 DIAGNOSIS — O0990 Supervision of high risk pregnancy, unspecified, unspecified trimester: Secondary | ICD-10-CM | POA: Insufficient documentation

## 2018-07-21 DIAGNOSIS — Z3A27 27 weeks gestation of pregnancy: Secondary | ICD-10-CM | POA: Diagnosis not present

## 2018-07-21 DIAGNOSIS — Z3402 Encounter for supervision of normal first pregnancy, second trimester: Secondary | ICD-10-CM

## 2018-07-21 DIAGNOSIS — Z3400 Encounter for supervision of normal first pregnancy, unspecified trimester: Secondary | ICD-10-CM

## 2018-07-21 NOTE — Progress Notes (Signed)
   PRENATAL VISIT NOTE  Subjective:  Gina Harrison is a 28 y.o. G1P0 at [redacted]w[redacted]d being seen today for ongoing prenatal care.  She is currently monitored for the following issues for this low-risk pregnancy and has Mild persistent asthma without complication and Encounter for supervision of low-risk first pregnancy, antepartum on their problem list.  Patient reports no complaints.  Contractions: Not present. Vag. Bleeding: None.  Movement: Present. Denies leaking of fluid.   The following portions of the patient's history were reviewed and updated as appropriate: allergies, current medications, past family history, past medical history, past social history, past surgical history and problem list.   Objective:   Vitals:   07/21/18 0832  BP: 116/76  Pulse: (!) 101  Weight: 200 lb 8 oz (90.9 kg)    Fetal Status: Fetal Heart Rate (bpm): 155 Fundal Height: 28 cm Movement: Present     General:  Alert, oriented and cooperative. Patient is in no acute distress.  Skin: Skin is warm and dry. No rash noted.   Cardiovascular: Normal heart rate noted  Respiratory: Normal respiratory effort, no problems with respiration noted  Abdomen: Soft, gravid, appropriate for gestational age.  Pain/Pressure: Absent     Pelvic: Cervical exam deferred        Extremities: Normal range of motion.  Edema: Trace  Mental Status: Normal mood and affect. Normal behavior. Normal judgment and thought content.   Assessment and Plan:  Pregnancy: G1P0 at [redacted]w[redacted]d 1. Encounter for supervision of low-risk first pregnancy, antepartum Patient is doing well Third trimester labs today Work note provided allowing patient to work from home if available Patient has a follow up growth ultrasound today - Glucose Tolerance, 2 Hours w/1 Hour - CBC - RPR - HIV Antibody (routine testing w rflx)  Preterm labor symptoms and general obstetric precautions including but not limited to vaginal bleeding, contractions, leaking of  fluid and fetal movement were reviewed in detail with the patient. Please refer to After Visit Summary for other counseling recommendations.   Return in about 2 weeks (around 08/04/2018) for ROB.  Future Appointments  Date Time Provider Department Center  07/21/2018 10:50 AM [facility] NURSE [facility]  07/21/2018 11:00 AM [facility] [place] 3 [facility]-US    Catalina Antigua, MD

## 2018-07-21 NOTE — Progress Notes (Signed)
Pt is here for ROB. [redacted]w[redacted]d.

## 2018-07-21 NOTE — Progress Notes (Signed)
Letter given  

## 2018-07-22 ENCOUNTER — Encounter: Payer: Self-pay | Admitting: *Deleted

## 2018-07-22 ENCOUNTER — Other Ambulatory Visit: Payer: Self-pay | Admitting: Obstetrics and Gynecology

## 2018-07-22 ENCOUNTER — Encounter: Payer: Self-pay | Admitting: Obstetrics and Gynecology

## 2018-07-22 DIAGNOSIS — O99019 Anemia complicating pregnancy, unspecified trimester: Secondary | ICD-10-CM | POA: Insufficient documentation

## 2018-07-22 LAB — GLUCOSE TOLERANCE, 2 HOURS W/ 1HR
GLUCOSE, 1 HOUR: 146 mg/dL (ref 65–179)
GLUCOSE, 2 HOUR: 93 mg/dL (ref 65–152)
Glucose, Fasting: 81 mg/dL (ref 65–91)

## 2018-07-22 LAB — CBC
HEMATOCRIT: 27.1 % — AB (ref 34.0–46.6)
HEMOGLOBIN: 9.3 g/dL — AB (ref 11.1–15.9)
MCH: 30.2 pg (ref 26.6–33.0)
MCHC: 34.3 g/dL (ref 31.5–35.7)
MCV: 88 fL (ref 79–97)
Platelets: 218 10*3/uL (ref 150–450)
RBC: 3.08 x10E6/uL — AB (ref 3.77–5.28)
RDW: 12.3 % (ref 11.7–15.4)
WBC: 11.6 10*3/uL — AB (ref 3.4–10.8)

## 2018-07-22 LAB — HIV ANTIBODY (ROUTINE TESTING W REFLEX): HIV SCREEN 4TH GENERATION: NONREACTIVE

## 2018-07-22 LAB — RPR: RPR: NONREACTIVE

## 2018-07-22 MED ORDER — FERROUS SULFATE 325 (65 FE) MG PO TABS: 325.0000 mg | ORAL_TABLET | Freq: Two times a day (BID) | ORAL | 1 refills | Status: DC

## 2018-08-04 ENCOUNTER — Encounter: Payer: 59 | Admitting: Obstetrics & Gynecology

## 2018-08-20 ENCOUNTER — Telehealth: Payer: Self-pay

## 2018-08-20 NOTE — Telephone Encounter (Signed)
Will you please follow up regarding the question below.    Thanks.  ----- Message -----  From: Winnifred Friar Little-Faison  Sent: 08/19/2018  2:33 AM EDT  To: Davina Poke [place] Admin  Subject: Appointment Request (HM)               Appointment Request From: Winnifred Friar Little-Faison    With Provider: Ginnie Smart, MD [facility] [place]]    Preferred Date Range: From 08/19/2018 To 08/27/2018    Preferred Times: Any    Reason: To address the following health maintenance concerns.  Tetanus/Tdap    Comments:  Can I get this at a local pharmacy?????   Informed pt that I am not sure if a pharmacy had the tdayp/tetanus injection but she could always go to her pcp for the injection. I informed [facility] to [facility] and see if they can administer it.

## 2018-08-27 ENCOUNTER — Other Ambulatory Visit: Payer: Self-pay

## 2018-08-27 ENCOUNTER — Ambulatory Visit (INDEPENDENT_AMBULATORY_CARE_PROVIDER_SITE_OTHER): Payer: 59 | Admitting: Obstetrics and Gynecology

## 2018-08-27 ENCOUNTER — Encounter: Payer: Self-pay | Admitting: Obstetrics and Gynecology

## 2018-08-27 DIAGNOSIS — Z3A32 32 weeks gestation of pregnancy: Secondary | ICD-10-CM

## 2018-08-27 DIAGNOSIS — Z3400 Encounter for supervision of normal first pregnancy, unspecified trimester: Secondary | ICD-10-CM

## 2018-08-27 DIAGNOSIS — O99013 Anemia complicating pregnancy, third trimester: Secondary | ICD-10-CM

## 2018-08-27 DIAGNOSIS — O99019 Anemia complicating pregnancy, unspecified trimester: Secondary | ICD-10-CM

## 2018-08-27 NOTE — Progress Notes (Signed)
Pt is on the phone preparing for Webex visit with provider. [redacted]w[redacted]d Babyscripts order sent and pt instructed to check email and activate so she can receive BP cuff and kit in the mail.

## 2018-08-27 NOTE — Progress Notes (Signed)
   PRENATAL VISIT NOTE TELEHEALTH VIRTUAL OBSTETRICS VISIT ENCOUNTER NOTE  I connected with Prisilla Harrison on 08/27/18 at 10:30 AM EDT by Webex at home and verified that I am speaking with the correct person using two identifiers.   I discussed the limitations, risks, security and privacy concerns of performing an evaluation and management service by telephone and the availability of in person appointments. I also discussed with the patient that there may be a patient responsible charge related to this service. The patient expressed understanding and agreed to proceed.  Subjective:  Gina Harrison is a 28 y.o. G1P0 at [redacted]w[redacted]d being seen today for ongoing prenatal care.  She is currently monitored for the following issues for this low-risk pregnancy and has Mild persistent asthma without complication; Encounter for supervision of low-risk first pregnancy, antepartum; and Anemia affecting pregnancy, antepartum on their problem list.  Patient reports no complaints.  Reports fetal movement. Contractions: Not present. Vag. Bleeding: None.  Movement: Present. Denies any contractions, bleeding or leaking of fluid. She went to urgent care a couple of weeks ago for her arm, was told she has bursitis. Swelling has gone done with prednisone.  The following portions of the patient's history were reviewed and updated as appropriate: allergies, current medications, past family history, past medical history, past social history, past surgical history and problem list.   Objective:  There were no vitals filed for this visit.  Fetal Status:     Movement: Present     General:  Alert, oriented and cooperative. Patient is in no acute distress.  Respiratory: Normal respiratory effort, no problems with respiration noted  Mental Status: Normal mood and affect. Normal behavior. Normal judgment and thought content.  Rest of physical exam deferred due to type of encounter  Assessment and Plan:   Pregnancy: G1P0 at [redacted]w[redacted]d  1. Encounter for supervision of low-risk first pregnancy, antepartum - Babyscripts Schedule Optimization - patient considering BTL, reviewed risks/permanancy, that we are not doing them in hospital due to COVID currently, she will consider  2. Anemia affecting pregnancy, antepartum Is taking iron BID, encouraged to continue  Preterm labor symptoms and general obstetric precautions including but not limited to vaginal bleeding, contractions, leaking of fluid and fetal movement were reviewed in detail with the patient. I discussed the assessment and treatment plan with the patient. The patient was provided an opportunity to ask questions and all were answered. The patient agreed with the plan and demonstrated an understanding of the instructions. The patient was advised to call back or seek an in-person office evaluation/go to [facility]U at [facility] for any urgent or concerning symptoms. Please refer to After Visit Summary for other counseling recommendations.   I provided 22 minutes of non-face-to-face time during this encounter. Return in about 2 weeks (around 09/10/2018) for OB visit (MD).  No future appointments.  Conan Bowens, MD [facility] for [facility], [facility]

## 2018-09-10 ENCOUNTER — Other Ambulatory Visit: Payer: Self-pay

## 2018-09-10 ENCOUNTER — Ambulatory Visit (INDEPENDENT_AMBULATORY_CARE_PROVIDER_SITE_OTHER): Payer: 59 | Admitting: Obstetrics

## 2018-09-10 ENCOUNTER — Encounter: Payer: Self-pay | Admitting: Obstetrics

## 2018-09-10 DIAGNOSIS — Z3403 Encounter for supervision of normal first pregnancy, third trimester: Secondary | ICD-10-CM

## 2018-09-10 DIAGNOSIS — Z3A34 34 weeks gestation of pregnancy: Secondary | ICD-10-CM

## 2018-09-10 DIAGNOSIS — Z3400 Encounter for supervision of normal first pregnancy, unspecified trimester: Secondary | ICD-10-CM

## 2018-09-10 NOTE — Progress Notes (Signed)
ROB with no concerns today.

## 2018-09-10 NOTE — Progress Notes (Signed)
   TELEHEALTH VIRTUAL OBSTETRICS PRENATAL VISIT ENCOUNTER NOTE  I connected with Gina Harrison on 09/10/18 at  3:00 PM EDT by WebEx at home and verified that I am speaking with the correct person using two identifiers.   I discussed the limitations, risks, security and privacy concerns of performing an evaluation and management service by telephone and the availability of in person appointments. I also discussed with the patient that there may be a patient responsible charge related to this service. The patient expressed understanding and agreed to proceed. Subjective:  Gina Harrison is a 28 y.o. G1P0 at [redacted]w[redacted]d being seen today for ongoing prenatal care.  She is currently monitored for the following issues for this low-risk pregnancy and has Mild persistent asthma without complication; Encounter for supervision of low-risk first pregnancy, antepartum; and Anemia affecting pregnancy, antepartum on their problem list.  Patient reports no complaints.  Reports fetal movement. Contractions: Not present. Vag. Bleeding: None.  Movement: Present. Denies any contractions, bleeding or leaking of fluid.   The following portions of the patient's history were reviewed and updated as appropriate: allergies, current medications, past family history, past medical history, past social history, past surgical history and problem list.   Objective:  There were no vitals filed for this visit.  Fetal Status:     Movement: Present     General:  Alert, oriented and cooperative. Patient is in no acute distress.  Respiratory: Normal respiratory effort, no problems with respiration noted  Mental Status: Normal mood and affect. Normal behavior. Normal judgment and thought content.  Rest of physical exam deferred due to type of encounter  Assessment and Plan:  Pregnancy: G1P0 at [redacted]w[redacted]d 1. Encounter for supervision of low-risk first pregnancy, antepartum   Preterm labor symptoms and general obstetric  precautions including but not limited to vaginal bleeding, contractions, leaking of fluid and fetal movement were reviewed in detail with the patient. I discussed the assessment and treatment plan with the patient. The patient was provided an opportunity to ask questions and all were answered. The patient agreed with the plan and demonstrated an understanding of the instructions. The patient was advised to call back or seek an in-person office evaluation/go to MAU at [facility] for any urgent or concerning symptoms. Please refer to After Visit Summary for other counseling recommendations.   I provided 10 minutes of face-to-face via WebEx time during this encounter.  Return in about 2 weeks (around 09/24/2018) for ROB.  GBS.   Coral Ceo, MD Center for [facility], [facility] 09-10-2018

## 2018-09-24 ENCOUNTER — Other Ambulatory Visit (HOSPITAL_COMMUNITY)
Admission: RE | Admit: 2018-09-24 | Discharge: 2018-09-24 | Disposition: A | Discharge status: Home / Self Care | Payer: 59 | Source: Ambulatory Visit | Attending: Obstetrics & Gynecology | Admitting: Obstetrics & Gynecology

## 2018-09-24 ENCOUNTER — Other Ambulatory Visit: Payer: Self-pay

## 2018-09-24 ENCOUNTER — Ambulatory Visit (INDEPENDENT_AMBULATORY_CARE_PROVIDER_SITE_OTHER): Payer: 59 | Admitting: Obstetrics & Gynecology

## 2018-09-24 VITALS — BP 116/74 | HR 102 | Wt 211.0 lb

## 2018-09-24 DIAGNOSIS — Z3400 Encounter for supervision of normal first pregnancy, unspecified trimester: Secondary | ICD-10-CM

## 2018-09-24 DIAGNOSIS — Z3A36 36 weeks gestation of pregnancy: Secondary | ICD-10-CM

## 2018-09-24 DIAGNOSIS — Z3401 Encounter for supervision of normal first pregnancy, first trimester: Secondary | ICD-10-CM | POA: Diagnosis not present

## 2018-09-24 DIAGNOSIS — Z3403 Encounter for supervision of normal first pregnancy, third trimester: Secondary | ICD-10-CM

## 2018-09-24 NOTE — Patient Instructions (Signed)

## 2018-09-24 NOTE — Progress Notes (Signed)
   PRENATAL VISIT NOTE  Subjective:  Gina Harrison is a 28 y.o. G1P0 at [redacted]w[redacted]d being seen today for ongoing prenatal care.  She is currently monitored for the following issues for this low-risk pregnancy and has Mild persistent asthma without complication; Encounter for supervision of low-risk first pregnancy, antepartum; and Anemia affecting pregnancy, antepartum on their problem list.  Patient reports no complaints.  Contractions: Irritability. Vag. Bleeding: None.  Movement: Present. Denies leaking of fluid.   The following portions of the patient's history were reviewed and updated as appropriate: allergies, current medications, past family history, past medical history, past social history, past surgical history and problem list.   Objective:   Vitals:   09/24/18 1608  BP: 116/74  Pulse: (!) 102  Weight: 211 lb (95.7 kg)    Fetal Status: Fetal Heart Rate (bpm): 146   Movement: Present  Presentation: Vertex  General:  Alert, oriented and cooperative. Patient is in no acute distress.  Skin: Skin is warm and dry. No rash noted.   Cardiovascular: Normal heart rate noted  Respiratory: Normal respiratory effort, no problems with respiration noted  Abdomen: Soft, gravid, appropriate for gestational age.  Pain/Pressure: Present     Pelvic: Cervical exam performed Dilation: Fingertip Effacement (%): 20 Station: Ballotable  Extremities: Normal range of motion.  Edema: None  Mental Status: Normal mood and affect. Normal behavior. Normal judgment and thought content.   Assessment and Plan:  Pregnancy: G1P0 at [redacted]w[redacted]d 1. Encounter for supervision of normal first pregnancy in first trimester Routine test  - Strep Gp B NAA - GC/Chlamydia probe amp ([place])not at [facility]  2. Encounter for supervision of low-risk first pregnancy, antepartum Bedside US by me cephalic, subjective nl AF  Preterm labor symptoms and general obstetric precautions including but not limited to vaginal  bleeding, contractions, leaking of fluid and fetal movement were reviewed in detail with the patient. Please refer to After Visit Summary for other counseling recommendations.   Return in about 1 week (around 10/01/2018) for virtual.  Future Appointments  Date Time Provider Department Center  10/01/2018  3:00 PM Sharyon Cable, CNM [facility] None    Scheryl Darter, MD

## 2018-09-25 LAB — GC/CHLAMYDIA PROBE AMP (~~LOC~~) NOT AT ARMC
Chlamydia: NEGATIVE
Neisseria Gonorrhea: NEGATIVE

## 2018-09-26 LAB — STREP GP B NAA: Strep Gp B NAA: POSITIVE — AB

## 2018-10-01 ENCOUNTER — Other Ambulatory Visit: Payer: Self-pay

## 2018-10-01 ENCOUNTER — Encounter: Payer: 59 | Admitting: Certified Nurse Midwife

## 2018-10-05 ENCOUNTER — Encounter: Payer: Self-pay | Admitting: Obstetrics

## 2018-10-05 ENCOUNTER — Ambulatory Visit: Payer: 59 | Admitting: Obstetrics

## 2018-10-05 DIAGNOSIS — Z3480 Encounter for supervision of other normal pregnancy, unspecified trimester: Secondary | ICD-10-CM

## 2018-10-05 NOTE — Progress Notes (Signed)
Pt was contacted for [facility] OB work up  Pt had to go back to work.   I have reviewed the chart and agree with nursing staff's documentation of this patient's encounter.  Baltazar Najjar, MD 10/05/2018 11:00 AM

## 2018-10-09 ENCOUNTER — Encounter: Payer: Self-pay | Admitting: *Deleted

## 2018-10-12 ENCOUNTER — Encounter: Payer: 59 | Admitting: Obstetrics and Gynecology

## 2018-10-14 ENCOUNTER — Telehealth (HOSPITAL_COMMUNITY): Payer: Self-pay | Admitting: *Deleted

## 2018-10-14 ENCOUNTER — Encounter: Payer: Self-pay | Admitting: Obstetrics & Gynecology

## 2018-10-14 ENCOUNTER — Other Ambulatory Visit: Payer: Self-pay

## 2018-10-14 ENCOUNTER — Encounter (HOSPITAL_COMMUNITY): Payer: Self-pay | Admitting: *Deleted

## 2018-10-14 ENCOUNTER — Ambulatory Visit (INDEPENDENT_AMBULATORY_CARE_PROVIDER_SITE_OTHER): Payer: 59 | Admitting: Obstetrics & Gynecology

## 2018-10-14 VITALS — BP 111/72 | HR 93 | Temp 97.0°F | Wt 217.0 lb

## 2018-10-14 DIAGNOSIS — Z3403 Encounter for supervision of normal first pregnancy, third trimester: Secondary | ICD-10-CM

## 2018-10-14 DIAGNOSIS — Z3400 Encounter for supervision of normal first pregnancy, unspecified trimester: Secondary | ICD-10-CM

## 2018-10-14 DIAGNOSIS — Z3A39 39 weeks gestation of pregnancy: Secondary | ICD-10-CM

## 2018-10-14 NOTE — Progress Notes (Signed)
ROB request cervix check.   CC: Dizzy Spells throughout the day has been going on for a few weeks now.

## 2018-10-14 NOTE — Telephone Encounter (Signed)
Preadmission screen  

## 2018-10-14 NOTE — Progress Notes (Signed)
   PRENATAL VISIT NOTE  Subjective:  Gina Harrison is a 28 y.o. G1P0 at [redacted]w[redacted]d being seen today for ongoing prenatal care.  She is currently monitored for the following issues for this low-risk pregnancy and has Mild persistent asthma without complication; Encounter for supervision of low-risk first pregnancy, antepartum; and Anemia affecting pregnancy, antepartum on their problem list.  Patient reports occasional dizziness.  Contractions: Irregular. Vag. Bleeding: None.  Movement: Present. Denies leaking of fluid.   The following portions of the patient's history were reviewed and updated as appropriate: allergies, current medications, past family history, past medical history, past social history, past surgical history and problem list.   Objective:   Vitals:   10/14/18 1006  BP: 111/72  Pulse: 93  Temp: (!) 97 F (36.1 C)  Weight: 217 lb (98.4 kg)    Fetal Status: Fetal Heart Rate (bpm): 140 Fundal Height: 39 cm Movement: Present  Presentation: Vertex  General:  Alert, oriented and cooperative. Patient is in no acute distress.  Skin: Skin is warm and dry. No rash noted.   Cardiovascular: Normal heart rate noted  Respiratory: Normal respiratory effort, no problems with respiration noted  Abdomen: Soft, gravid, appropriate for gestational age.  Pain/Pressure: Present     Pelvic: Cervical exam performed Dilation: Fingertip Effacement (%): 40 Station: -3  Extremities: Normal range of motion.  Edema: Trace  Mental Status: Normal mood and affect. Normal behavior. Normal judgment and thought content.   Assessment and Plan:  Pregnancy: G1P0 at [redacted]w[redacted]d 1. Encounter for supervision of low-risk first pregnancy, antepartum 41 week IOL schedules 6/27  Term labor symptoms and general obstetric precautions including but not limited to vaginal bleeding, contractions, leaking of fluid and fetal movement were reviewed in detail with the patient. Please refer to After Visit Summary for  other counseling recommendations.   Return in about 1 week (around 10/21/2018).  Future Appointments  Date Time Provider [place]  10/21/2018  4:15 PM Emily Filbert, MD [facility] None  10/24/2018  7:00 AM [facility] SCHED ROOM [facility] None    Emeterio Reeve, MD

## 2018-10-14 NOTE — Patient Instructions (Signed)

## 2018-10-17 ENCOUNTER — Other Ambulatory Visit: Payer: Self-pay | Admitting: Advanced Practice Midwife

## 2018-10-20 ENCOUNTER — Other Ambulatory Visit: Payer: Self-pay | Admitting: Advanced Practice Midwife

## 2018-10-21 ENCOUNTER — Ambulatory Visit (INDEPENDENT_AMBULATORY_CARE_PROVIDER_SITE_OTHER): Payer: 59 | Admitting: Obstetrics & Gynecology

## 2018-10-21 ENCOUNTER — Other Ambulatory Visit: Payer: Self-pay

## 2018-10-21 ENCOUNTER — Other Ambulatory Visit (HOSPITAL_COMMUNITY)
Admission: RE | Admit: 2018-10-21 | Discharge: 2018-10-21 | Disposition: A | Discharge status: Home / Self Care | Payer: 59 | Source: Ambulatory Visit | Attending: Obstetrics and Gynecology | Admitting: Obstetrics and Gynecology

## 2018-10-21 VITALS — BP 122/75 | HR 103 | Wt 221.5 lb

## 2018-10-21 DIAGNOSIS — Z3A40 40 weeks gestation of pregnancy: Secondary | ICD-10-CM

## 2018-10-21 DIAGNOSIS — Z3403 Encounter for supervision of normal first pregnancy, third trimester: Secondary | ICD-10-CM

## 2018-10-21 DIAGNOSIS — Z1159 Encounter for screening for other viral diseases: Secondary | ICD-10-CM | POA: Insufficient documentation

## 2018-10-21 DIAGNOSIS — Z3400 Encounter for supervision of normal first pregnancy, unspecified trimester: Secondary | ICD-10-CM

## 2018-10-21 LAB — SARS CORONAVIRUS 2 (TAT 6-24 HRS): SARS Coronavirus 2: NEGATIVE

## 2018-10-21 NOTE — Progress Notes (Signed)
Patient reports fetal movement with some contractions. 

## 2018-10-21 NOTE — Progress Notes (Signed)
   PRENATAL VISIT NOTE  Subjective:  Gina Harrison is a 28 y.o. G1P0 at [redacted]w[redacted]d being seen today for ongoing prenatal care.  She is currently monitored for the following issues for this low-risk pregnancy and has Mild persistent asthma without complication; Encounter for supervision of low-risk first pregnancy, antepartum; and Anemia affecting pregnancy, antepartum on their problem list.  Patient reports no complaints.   .  .   . Denies leaking of fluid.   The following portions of the patient's history were reviewed and updated as appropriate: allergies, current medications, past family history, past medical history, past social history, past surgical history and problem list.   Objective:  There were no vitals filed for this visit.  Fetal Status:           General:  Alert, oriented and cooperative. Patient is in no acute distress.  Skin: Skin is warm and dry. No rash noted.   Cardiovascular: Normal heart rate noted  Respiratory: Normal respiratory effort, no problems with respiration noted  Abdomen: Soft, gravid, appropriate for gestational age.        Pelvic: Cervical exam deferred        Extremities: Normal range of motion.     Mental Status: Normal mood and affect. Normal behavior. Normal judgment and thought content.   Assessment and Plan:  Pregnancy: G1P0 at [redacted]w[redacted]d 1. Encounter for supervision of low-risk first pregnancy, antepartum   Preterm labor symptoms and general obstetric precautions including but not limited to vaginal bleeding, contractions, leaking of fluid and fetal movement were reviewed in detail with the patient. Please refer to After Visit Summary for other counseling recommendations.   No follow-ups on file.  Future Appointments  Date Time Provider [place]  10/25/2018 12:00 AM [facility] SCHED ROOM [facility] None    Emily Filbert, MD

## 2018-10-21 NOTE — MAU Note (Signed)
Covid swab collected. PT tolerated well. PT asymptomatic 

## 2018-10-21 NOTE — Progress Notes (Signed)
   PRENATAL VISIT NOTE  Subjective:  Gina Harrison is a 28 y.o. G1P0 at [redacted]w[redacted]d being seen today for ongoing prenatal care.  She is currently monitored for the following issues for this low-risk pregnancy and has Mild persistent asthma without complication; Encounter for supervision of low-risk first pregnancy, antepartum; and Anemia affecting pregnancy, antepartum on their problem list.  Patient reports no complaints.  Contractions: Irregular. Vag. Bleeding: None.  Movement: Present. Denies leaking of fluid.   The following portions of the patient's history were reviewed and updated as appropriate: allergies, current medications, past family history, past medical history, past social history, past surgical history and problem list.   Objective:   Vitals:   10/21/18 1617  BP: 122/75  Pulse: (!) 103  Weight: 221 lb 8 oz (100.5 kg)    Fetal Status:     Movement: Present     General:  Alert, oriented and cooperative. Patient is in no acute distress.  Skin: Skin is warm and dry. No rash noted.   Cardiovascular: Normal heart rate noted  Respiratory: Normal respiratory effort, no problems with respiration noted  Abdomen: Soft, gravid, appropriate for gestational age.  Pain/Pressure: Present     Pelvic: Cervical exam performed        Extremities: Normal range of motion.  Edema: Trace  Mental Status: Normal mood and affect. Normal behavior. Normal judgment and thought content.   Assessment and Plan:  Pregnancy: G1P0 at [redacted]w[redacted]d 1. Encounter for supervision of low-risk first pregnancy, antepartum - IOL at 41 weeks - NST today  Term labor symptoms and general obstetric precautions including but not limited to vaginal bleeding, contractions, leaking of fluid and fetal movement were reviewed in detail with the patient. Please refer to After Visit Summary for other counseling recommendations.   Return in about 5 weeks (around 11/25/2018) for postpartum exam.  Future Appointments   Date Time Provider [place]  10/25/2018 12:00 AM [facility] SCHED ROOM [facility] None    Emily Filbert, MD

## 2018-10-23 NOTE — Addendum Note (Signed)
Addended by: Emily Filbert on: 10/23/2018 03:29 PM   Modules accepted: Orders, SmartSet

## 2018-10-24 ENCOUNTER — Inpatient Hospital Stay (HOSPITAL_COMMUNITY): Payer: 59

## 2018-10-25 ENCOUNTER — Inpatient Hospital Stay (HOSPITAL_COMMUNITY)
Admission: RE | Admit: 2018-10-25 | Discharge: 2018-10-27 | DRG: 807 | Disposition: A | Discharge status: Home / Self Care | Payer: 59 | Attending: Obstetrics and Gynecology | Admitting: Obstetrics and Gynecology

## 2018-10-25 ENCOUNTER — Inpatient Hospital Stay (HOSPITAL_COMMUNITY): Payer: 59 | Admitting: Anesthesiology

## 2018-10-25 ENCOUNTER — Other Ambulatory Visit: Payer: Self-pay

## 2018-10-25 ENCOUNTER — Encounter (HOSPITAL_COMMUNITY): Payer: Self-pay

## 2018-10-25 ENCOUNTER — Inpatient Hospital Stay (HOSPITAL_COMMUNITY): Payer: 59

## 2018-10-25 DIAGNOSIS — J4530 Mild persistent asthma, uncomplicated: Secondary | ICD-10-CM | POA: Diagnosis present

## 2018-10-25 DIAGNOSIS — O99824 Streptococcus B carrier state complicating childbirth: Secondary | ICD-10-CM | POA: Diagnosis present

## 2018-10-25 DIAGNOSIS — O9902 Anemia complicating childbirth: Secondary | ICD-10-CM | POA: Diagnosis present

## 2018-10-25 DIAGNOSIS — O99019 Anemia complicating pregnancy, unspecified trimester: Secondary | ICD-10-CM | POA: Diagnosis present

## 2018-10-25 DIAGNOSIS — O480 Post-term pregnancy: Secondary | ICD-10-CM | POA: Diagnosis present

## 2018-10-25 DIAGNOSIS — O9952 Diseases of the respiratory system complicating childbirth: Secondary | ICD-10-CM | POA: Diagnosis present

## 2018-10-25 DIAGNOSIS — D649 Anemia, unspecified: Secondary | ICD-10-CM | POA: Diagnosis present

## 2018-10-25 DIAGNOSIS — Z3A41 41 weeks gestation of pregnancy: Secondary | ICD-10-CM

## 2018-10-25 DIAGNOSIS — Z8759 Personal history of other complications of pregnancy, childbirth and the puerperium: Secondary | ICD-10-CM

## 2018-10-25 LAB — CBC
HCT: 24.4 % — ABNORMAL LOW (ref 36.0–46.0)
Hemoglobin: 7.2 g/dL — ABNORMAL LOW (ref 12.0–15.0)
MCH: 22.9 pg — ABNORMAL LOW (ref 26.0–34.0)
MCHC: 29.5 g/dL — ABNORMAL LOW (ref 30.0–36.0)
MCV: 77.5 fL — ABNORMAL LOW (ref 80.0–100.0)
Platelets: 192 10*3/uL (ref 150–400)
RBC: 3.15 MIL/uL — ABNORMAL LOW (ref 3.87–5.11)
RDW: 17.7 % — ABNORMAL HIGH (ref 11.5–15.5)
WBC: 9.7 10*3/uL (ref 4.0–10.5)
nRBC: 0.3 % — ABNORMAL HIGH (ref 0.0–0.2)

## 2018-10-25 LAB — RPR: RPR Ser Ql: NONREACTIVE

## 2018-10-25 LAB — PREPARE RBC (CROSSMATCH)

## 2018-10-25 MED ORDER — BENZOCAINE-MENTHOL 20-0.5 % EX AERO
1.0000 "application " | INHALATION_SPRAY | CUTANEOUS | Status: DC | PRN
  Administered 2018-10-25 – 2018-10-27 (×2): 1 via TOPICAL
  Filled 2018-10-25 (×2): qty 56

## 2018-10-25 MED ORDER — PENICILLIN G 3 MILLION UNITS IVPB - SIMPLE MED
3.0000 10*6.[IU] | INTRAVENOUS | Status: DC
  Administered 2018-10-25 (×3): 3 10*6.[IU] via INTRAVENOUS
  Filled 2018-10-25 (×10): qty 100

## 2018-10-25 MED ORDER — EPHEDRINE 5 MG/ML INJ: 10.0000 mg | INTRAVENOUS | Status: DC | PRN

## 2018-10-25 MED ORDER — PRENATAL MULTIVITAMIN CH
1.0000 | ORAL_TABLET | Freq: Every day | ORAL | Status: DC
  Administered 2018-10-26 – 2018-10-27 (×2): 1 via ORAL
  Filled 2018-10-25 (×2): qty 1

## 2018-10-25 MED ORDER — ONDANSETRON HCL 4 MG PO TABS: 4.0000 mg | ORAL_TABLET | ORAL | Status: DC | PRN

## 2018-10-25 MED ORDER — ONDANSETRON HCL 4 MG/2ML IJ SOLN: 4.0000 mg | Freq: Four times a day (QID) | INTRAMUSCULAR | Status: DC | PRN

## 2018-10-25 MED ORDER — SENNOSIDES-DOCUSATE SODIUM 8.6-50 MG PO TABS
2.0000 | ORAL_TABLET | ORAL | Status: DC
  Administered 2018-10-26: 2 via ORAL
  Filled 2018-10-25 (×2): qty 2

## 2018-10-25 MED ORDER — FENTANYL-BUPIVACAINE-NACL 0.5-0.125-0.9 MG/250ML-% EP SOLN
12.0000 mL/h | EPIDURAL | Status: DC | PRN
  Filled 2018-10-25: qty 250

## 2018-10-25 MED ORDER — WITCH HAZEL-GLYCERIN EX PADS: 1.0000 "application " | MEDICATED_PAD | CUTANEOUS | Status: DC | PRN

## 2018-10-25 MED ORDER — SODIUM CHLORIDE 0.9 % IV SOLN
5.0000 10*6.[IU] | Freq: Once | INTRAVENOUS | Status: AC
  Administered 2018-10-25: 5 10*6.[IU] via INTRAVENOUS
  Filled 2018-10-25: qty 5

## 2018-10-25 MED ORDER — FENTANYL CITRATE (PF) 100 MCG/2ML IJ SOLN
INTRAMUSCULAR | Status: AC
  Filled 2018-10-25: qty 2

## 2018-10-25 MED ORDER — ONDANSETRON HCL 4 MG/2ML IJ SOLN: 4.0000 mg | INTRAMUSCULAR | Status: DC | PRN

## 2018-10-25 MED ORDER — DIPHENHYDRAMINE HCL 50 MG/ML IJ SOLN: 12.5000 mg | INTRAMUSCULAR | Status: DC | PRN

## 2018-10-25 MED ORDER — FENTANYL CITRATE (PF) 100 MCG/2ML IJ SOLN
100.0000 ug | INTRAMUSCULAR | Status: DC | PRN
  Administered 2018-10-25 (×2): 100 ug via INTRAVENOUS
  Filled 2018-10-25: qty 2

## 2018-10-25 MED ORDER — PHENYLEPHRINE 40 MCG/ML (10ML) SYRINGE FOR IV PUSH (FOR BLOOD PRESSURE SUPPORT): 80.0000 ug | PREFILLED_SYRINGE | INTRAVENOUS | Status: DC | PRN

## 2018-10-25 MED ORDER — OXYTOCIN 40 UNITS IN NORMAL SALINE INFUSION - SIMPLE MED
2.5000 [IU]/h | INTRAVENOUS | Status: DC
  Filled 2018-10-25: qty 1000

## 2018-10-25 MED ORDER — OXYTOCIN BOLUS FROM INFUSION
500.0000 mL | Freq: Once | INTRAVENOUS | Status: AC
  Administered 2018-10-25: 500 mL via INTRAVENOUS

## 2018-10-25 MED ORDER — OXYCODONE-ACETAMINOPHEN 5-325 MG PO TABS: 2.0000 | ORAL_TABLET | ORAL | Status: DC | PRN

## 2018-10-25 MED ORDER — DIBUCAINE (PERIANAL) 1 % EX OINT: 1.0000 "application " | TOPICAL_OINTMENT | CUTANEOUS | Status: DC | PRN

## 2018-10-25 MED ORDER — DIPHENHYDRAMINE HCL 25 MG PO CAPS: 25.0000 mg | ORAL_CAPSULE | Freq: Four times a day (QID) | ORAL | Status: DC | PRN

## 2018-10-25 MED ORDER — SODIUM CHLORIDE (PF) 0.9 % IJ SOLN
INTRAMUSCULAR | Status: DC | PRN
  Administered 2018-10-25: 14 mL/h via EPIDURAL

## 2018-10-25 MED ORDER — SIMETHICONE 80 MG PO CHEW: 80.0000 mg | CHEWABLE_TABLET | ORAL | Status: DC | PRN

## 2018-10-25 MED ORDER — LACTATED RINGERS IV SOLN: 500.0000 mL | Freq: Once | INTRAVENOUS | Status: DC

## 2018-10-25 MED ORDER — LIDOCAINE HCL (PF) 1 % IJ SOLN
INTRAMUSCULAR | Status: DC | PRN
  Administered 2018-10-25: 11 mL via EPIDURAL

## 2018-10-25 MED ORDER — ZOLPIDEM TARTRATE 5 MG PO TABS: 5.0000 mg | ORAL_TABLET | Freq: Every evening | ORAL | Status: DC | PRN

## 2018-10-25 MED ORDER — ACETAMINOPHEN 325 MG PO TABS
650.0000 mg | ORAL_TABLET | ORAL | Status: DC | PRN
  Administered 2018-10-26 – 2018-10-27 (×2): 650 mg via ORAL
  Filled 2018-10-25 (×2): qty 2

## 2018-10-25 MED ORDER — MISOPROSTOL 25 MCG QUARTER TABLET
25.0000 ug | ORAL_TABLET | ORAL | Status: DC | PRN
  Administered 2018-10-25: 25 ug via VAGINAL
  Filled 2018-10-25: qty 1

## 2018-10-25 MED ORDER — SOD CITRATE-CITRIC ACID 500-334 MG/5ML PO SOLN: 30.0000 mL | ORAL | Status: DC | PRN

## 2018-10-25 MED ORDER — OXYTOCIN 40 UNITS IN NORMAL SALINE INFUSION - SIMPLE MED
1.0000 m[IU]/min | INTRAVENOUS | Status: DC
  Administered 2018-10-25: 2 m[IU]/min via INTRAVENOUS

## 2018-10-25 MED ORDER — ACETAMINOPHEN 325 MG PO TABS: 650.0000 mg | ORAL_TABLET | ORAL | Status: DC | PRN

## 2018-10-25 MED ORDER — TETANUS-DIPHTH-ACELL PERTUSSIS 5-2.5-18.5 LF-MCG/0.5 IM SUSP: 0.5000 mL | Freq: Once | INTRAMUSCULAR | Status: DC

## 2018-10-25 MED ORDER — OXYCODONE-ACETAMINOPHEN 5-325 MG PO TABS: 1.0000 | ORAL_TABLET | ORAL | Status: DC | PRN

## 2018-10-25 MED ORDER — LACTATED RINGERS IV SOLN: 500.0000 mL | INTRAVENOUS | Status: DC | PRN

## 2018-10-25 MED ORDER — COCONUT OIL OIL: 1.0000 "application " | TOPICAL_OIL | Status: DC | PRN

## 2018-10-25 MED ORDER — IBUPROFEN 600 MG PO TABS
600.0000 mg | ORAL_TABLET | Freq: Four times a day (QID) | ORAL | Status: DC
  Administered 2018-10-25 – 2018-10-27 (×7): 600 mg via ORAL
  Filled 2018-10-25 (×8): qty 1

## 2018-10-25 MED ORDER — LACTATED RINGERS IV SOLN
INTRAVENOUS | Status: DC
  Administered 2018-10-25 (×3): via INTRAVENOUS

## 2018-10-25 MED ORDER — FERROUS SULFATE 325 (65 FE) MG PO TABS
325.0000 mg | ORAL_TABLET | Freq: Two times a day (BID) | ORAL | Status: DC
  Administered 2018-10-25 – 2018-10-27 (×4): 325 mg via ORAL
  Filled 2018-10-25 (×4): qty 1

## 2018-10-25 MED ORDER — TERBUTALINE SULFATE 1 MG/ML IJ SOLN: 0.2500 mg | Freq: Once | INTRAMUSCULAR | Status: DC | PRN

## 2018-10-25 MED ORDER — LIDOCAINE HCL (PF) 1 % IJ SOLN
30.0000 mL | INTRAMUSCULAR | Status: AC | PRN
  Administered 2018-10-25: 30 mL via SUBCUTANEOUS
  Filled 2018-10-25: qty 30

## 2018-10-25 NOTE — H&P (Addendum)
Obstetric Admission History & Physical  Subjective Gina Harrison is a 28 y.o. female G1P0 with IUP at [redacted]w[redacted]d by LMP at 10 wks admitted for induction of labor due to Post dates.   Reports fetal movement. Denies vaginal bleeding and ROM. She received her prenatal care at [facility].  Support person in labor: [place] Staff Provider  Office Location  [place] Dating   LMP: 01/10/18 EDD: 10/17/2018, 19.4 week [place]  Language   English Anatomy US  Wnl,   Flu Vaccine  Received from job: 01/27/2018 Genetic Screen  NIPS: wnl  AFP:      TDaP vaccine   pt states she got vaccine at [facility] in May Hgb A1C or  GTT Early  Third trimester nl 2 hour  Rhogam   n/a   LAB RESULTS   Feeding Plan  Breast Blood Type O/Positive/-- (11/25 1202)   Contraception  Undecided 10-21-18 Antibody Negative (11/25 1202)  Circumcision  Yes Rubella 11.40 (11/25 1202)  Pediatrician   [facility] RPR Non Reactive (03/24 0826)   Support Person  Joellyn Rued HBsAg Negative (11/25 1202)   Prenatal Classes  No HIV Non Reactive (03/24 0826)  BTL Consent  GBS Positive (05/28 0423)(For PCN allergy, check sensitivities)   VBAC Consent  Pap negative    Hgb Electro  normal    CF     SMA     Waterbirth  [ ]  Class [ ]  Consent [ ]  CNM visit   Ultrasounds . 19+2 - anatomy - possible right peristent umbilical vein - other wnl . 23+3 - anatomy complete . 27+3 - normal interval growth. EFW 1225g, 73%ile  Prenatal History/Complications: . Headaches (Fioricet) . Anemia  Past Medical History:  Diagnosis Date  . Angio-edema   . Mild persistent asthma without complication 05/04/1094   Past Surgical History:  Procedure Laterality Date  . TONSILLECTOMY    . TYMPANOSTOMY TUBE PLACEMENT     OB History    Gravida  1   Para      Term      Preterm      AB      Living  0     SAB      TAB      Ectopic      Multiple      Live Births             Social History   Socioeconomic History  . Marital status:  Single    Spouse name: Not on file  . Number of children: Not on file  . Years of education: Not on file  . Highest education level: Not on file  Occupational History  . Not on file  Social Needs  . Financial resource strain: Not hard at all  . Food insecurity    Worry: Never true    Inability: Never true  . Transportation needs    Medical: No    Non-medical: Not on file  Tobacco Use  . Smoking status: Never Smoker  . Smokeless tobacco: Never Used  Substance and Sexual Activity  . Alcohol use: Not Currently    Comment: occ  . Drug use: Never  . Sexual activity: Not Currently    Birth control/protection: None  Lifestyle  . Physical activity    Days per week: Not on file    Minutes per session: Not on file  . Stress: Not at all  Relationships  . Social connections    Talks on phone: Not on  file    Gets together: Not on file    Attends religious service: Not on file    Active member of club or organization: Not on file    Attends meetings of clubs or organizations: Not on file    Relationship status: Not on file  Other Topics Concern  . Not on file  Social History Narrative  . Not on file   Family History  Problem Relation Age of Onset  . Allergic rhinitis Maternal Grandfather   . Hypertension Mother   . Angioedema Neg Hx   . Asthma Neg Hx   . Eczema Neg Hx   . Immunodeficiency Neg Hx   . Urticaria Neg Hx    Allergies: Allergies  Allergen Reactions  . Apple   . Carrot Oil   . Celery Oil   . Kiwi Extract   . Latex Itching  . Peach [Prunus Persica]   . Peanut-Containing Drug Products     Tree nuts   Medications:  No outpatient medications have been marked as taking for the 10/25/18 encounter [facility](Hospital Encounter).    Review of Systems  All systems reviewed and negative except as stated in HPI  Objective Physical Exam:  BP 111/60   Pulse 87   Temp 98.5 F (36.9 C) (Oral)   Resp 15   Ht 5\' 8"  (1.727 m)   Wt 100 kg   LMP 01/10/2018 (Exact Date)    BMI 33.53 kg/m  General appearance: alert, cooperative and appears stated age Lungs: no respiratory distress Heart: RRR. No BLEE.  Abdomen: soft, non-tender; gravid  Pelvic:  Extremities: Moving spontaneously, warm, well perfused. 2+ DP.  Uterine activity: irregular Cervical Exam:  Dilation: Fingertip Effacement (%): 50 Station: Ballotable Exam by:: Leanna SatoBre Price, RN Presentation: cephalic Fetal monitoring: baseline 130 / mod variability/ +a / -d  Prenatal labs: ABO, Rh: --/--/O POS, O POS Performed at [facility], [address] [place], [place] [address]  [phone](06/28 0050) Antibody: NEG (06/28 0050) Rubella: 11.40 (11/25 1202) RPR: Non Reactive (03/24 0826)  HBsAg: Negative (11/25 1202)  HIV: Non Reactive (03/24 0826)  GBS: Positive (05/28 0423)  Glucola: nl 2 hour  Genetic screening:  wnl  Prenatal Transfer Tool  Maternal Diabetes: No Genetic Screening: Normal Maternal Ultrasounds/Referrals: Normal Fetal Ultrasounds or other Referrals:  None Maternal Substance Abuse:  No Significant Maternal Medications:  None Significant Maternal Lab Results: Group B Strep positive   Patient Active Problem List   Diagnosis Date Noted  . Post-dates pregnancy 10/25/2018  . Personal history of previous postdates pregnancy 10/25/2018  . Anemia affecting pregnancy, antepartum 07/22/2018  . Encounter for supervision of low-risk first pregnancy, antepartum 03/23/2018  . Mild persistent asthma without complication 09/03/2016    Assessment & Plan:  Gina Harrison is a 28 y.o. G1P0 at 1262w1d - PDIOL  Labor: Cytotec x 1 (0145), will likely attempt to place FB at next check.  . Pain control: IV pain meds and wants epidural . Anticipated MOD: NSVD  Fetal Wellbeing: EFW 7lb by leopolds. Cephalic by CE.  Category I tracing. . GBS: Positive (05/28 0423) > PCN  . Continuous fetal monitoring  Postpartum Planning . Girl/both/depo . Rubella immune, Tdap provided in May  2019  Genia Hotterachel Kim, M.D.  Family Medicine  PGY-1 10/25/2018 5:15 AM   Attestation: I have seen this patient and agree with the resident's documentation. I have examined them separately, and we have discussed the plan of care.  Cristal DeerLaurel S. Earlene PlaterWallace, DO OB/GYN  Fellow

## 2018-10-25 NOTE — Progress Notes (Signed)
Subjective: Doing well. Feeling more pressure. No pain with epidural in place. RN called for CNM to AROM.  Objective: BP 124/78   Pulse 81   Temp 98.1 F (36.7 C) (Oral)   Resp 18   Ht 5\' 8"  (1.727 m)   Wt 100 kg   LMP 01/10/2018 (Exact Date)   BMI 33.53 kg/m    FHT:  FHR: 130 bpm, variability: moderate,  accelerations:  Present,  decelerations:  Absent UC:   regular, every 2-3 minutes SVE:   Dilation: 6 Effacement (%): 80 Station: -1 Exam by:: Renato Battles, CNM AROM with moderate amount of light meconium stained fluid in return  pt tolerated well  Labs: Lab Results  Component Value Date   WBC 9.7 10/25/2018   HGB 7.2 (L) 10/25/2018   HCT 24.4 (L) 10/25/2018   MCV 77.5 (L) 10/25/2018   PLT 192 10/25/2018    Assessment / Plan: Spontaneous labor, progressing normally  Labor: Progressing on Pitocin, will continue to increase Preeclampsia:  n/a Fetal Wellbeing:  Category I Pain Control:  Epidural I/D:  n/a Anticipated MOD:  NSVD  Laury Deep, MSN, CNM 10/25/2018, 12:56 PM

## 2018-10-25 NOTE — Anesthesia Procedure Notes (Signed)
Epidural Patient location during procedure: OB Start time: 10/25/2018 9:26 AM End time: 10/25/2018 9:38 AM  Staffing Anesthesiologist: Lynda Rainwater, MD Performed: anesthesiologist   Preanesthetic Checklist Completed: patient identified, site marked, surgical consent, pre-op evaluation, timeout performed, IV checked, risks and benefits discussed and monitors and equipment checked  Epidural Patient position: sitting Prep: ChloraPrep Patient monitoring: heart rate, cardiac monitor, continuous pulse ox and blood pressure Approach: midline Location: L2-L3 Injection technique: LOR saline  Needle:  Needle type: Tuohy  Needle gauge: 17 G Needle length: 9 cm Needle insertion depth: 5 cm Catheter type: closed end flexible Catheter size: 20 Guage Catheter at skin depth: 9 cm Test dose: negative  Assessment Events: blood not aspirated, injection not painful, no injection resistance, negative IV test and no paresthesia  Additional Notes Reason for block:procedure for pain

## 2018-10-25 NOTE — Discharge Summary (Addendum)
Postpartum Discharge Summary     Patient Name: Gina Harrison DOB: 11-05-1990 MRN: [id number]  Date of admission: 10/25/2018 Delivering Provider: Jacklyn ShellRESENZO-DISHMON, FRANCES   Date of discharge: 10/27/2018  Admitting diagnosis: Preg Intrauterine pregnancy: 6918w1d     Secondary diagnosis:  Principal Problem:   Post-dates pregnancy Active Problems:   Mild persistent asthma without complication   Anemia affecting pregnancy, antepartum   Personal history of previous postdates pregnancy  Additional problems: Shoulder Dystocia x 1 minute   Discharge diagnosis: Term Pregnancy Delivered                                                         Postpartum procedures:Depo Provera, IV feraheme Augmentation: AROM, Pitocin, Cytotec and Foley Balloon Complications: None  Hospital course:  Induction of Labor With Vaginal Delivery   28 y.o. yo G1P1001 at 1318w1d was admitted to the hospital 10/25/2018 for induction of labor.  Indication for induction: Postdates.  Patient had an uncomplicated labor course as follows: Membrane Rupture Time/Date: 12:52 PM ,10/25/2018   Intrapartum Procedures: Episiotomy: None [1]                                         Lacerations:  2nd degree [3];Vaginal [6];Sulcus [9]  Patient had delivery of a Viable infant.  Information for the patient's newborn:  Harrison, Girl Krishna [[id number]][[id number]]  Delivery Method: Vaginal, Spontaneous(Filed from Delivery Summary)   10/25/2018  Details of delivery can be found in separate delivery note.  Patient had a routine postpartum course. Patient is discharged home 10/27/18.  Magnesium Sulfate recieved: No BMZ received: No  Physical exam  Vitals:   10/26/18 0532 10/26/18 1542 10/26/18 2205 10/27/18 0534  BP: 121/81 119/66 117/66 109/68  Pulse: 84 97 88 76  Resp: 18 18 20 18   Temp: 98.8 F (37.1 C) 98.6 F (37 C) 98.6 F (37 C) 98.2 F (36.8 C)  TempSrc: Oral Oral Oral Oral  SpO2: 100%  99% 98%  Weight:      Height:        General: alert, cooperative and no distress Lochia: appropriate Uterine Fundus: firm Incision: N/A DVT Evaluation: No evidence of DVT seen on physical exam.  Labs: Lab Results  Component Value Date   WBC 11.3 (H) 10/26/2018   HGB 6.4 (LL) 10/26/2018   HCT 21.7 (L) 10/26/2018   MCV 76.7 (L) 10/26/2018   PLT 172 10/26/2018   No flowsheet data found.  Discharge instruction: per After Visit Summary and "Baby and Me Booklet".  After visit meds:  Allergies as of 10/27/2018      Reactions   Apple    Carrot Oil    Celery Oil    Kiwi Extract    Latex Itching   Peach [prunus Persica]    Peanut-containing Drug Products    Tree nuts      Medication List    STOP taking these medications   butalbital-acetaminophen-caffeine 50-325-40 MG tablet Commonly known as: FIORICET   Doxylamine-Pyridoxine 10-10 MG Tbec Commonly known as: Diclegis   ferrous sulfate 325 (65 FE) MG tablet Commonly known as: FerrouSul   fluticasone 50 MCG/ACT nasal spray Commonly known as: Flonase   metoCLOPramide 10 MG tablet Commonly known as: Reglan  metroNIDAZOLE 500 MG tablet Commonly known as: FLAGYL   montelukast 10 MG tablet Commonly known as: Singulair     TAKE these medications   acetaminophen 325 MG tablet Commonly known as: Tylenol Take 2 tablets (650 mg total) by mouth every 4 (four) hours as needed (for pain scale < 4).   albuterol 108 (90 Base) MCG/ACT inhaler Commonly known as: VENTOLIN HFA 2 puffs every 4 hours as needed for coughing or wheezing spells What changed: Another medication with the same name was removed. Continue taking this medication, and follow the directions you see here.   EPINEPHrine 0.3 mg/0.3 mL Soaj injection Commonly known as: EpiPen 2-Pak Use as directed for severe allergic reactions   ibuprofen 600 MG tablet Commonly known as: ADVIL Take 1 tablet (600 mg total) by mouth every 6 (six) hours.   Vitafol Gummies 3.33-0.333-34.8 MG Chew Chew 3  tablets by mouth daily.       Diet: routine diet  Activity: Advance as tolerated. Pelvic rest for 6 weeks.   Outpatient follow up:6 weeks Follow up Appt: Future Appointments  Date Time Provider [place]  11/30/2018  1:00 PM Lavonia Drafts, MD [place] None   Follow up Visit: Please schedule this patient for Postpartum visit in: 6 weeks with the following provider: MD For C/S patients schedule nurse incision check in weeks 2 weeks: no Low risk pregnancy complicated by: anemia Delivery mode:  SVD Anticipated Birth Control:  other/unsure -- Depo prior to discharge  PP Procedures needed: none  Schedule Integrated [place] visit: no  Newborn Data: Live born female "Zaya" Birth Weight: 9 lb 0.3 oz (4090 g) APGAR: 6, 9  Newborn Delivery   Birth date/time: 10/25/2018 14:40:00 Delivery type: Vaginal, Spontaneous     Baby Feeding: Bottle and Breast Disposition: home  10/27/2018 Glenice Bow, DO   Attestation: I have seen this patient and agree with the resident's documentation. I have examined them separately, and we have discussed the plan of care. Asymptomatic from anemia perspective, will give IV feraheme.   Lambert Mody. Juleen China, DO OB/GYN Fellow

## 2018-10-25 NOTE — Anesthesia Preprocedure Evaluation (Signed)
Anesthesia Evaluation  Patient identified by MRN, date of birth, ID band Patient awake    Reviewed: Allergy & Precautions, NPO status , Patient's Chart, lab work & pertinent test results  Airway Mallampati: II  TM Distance: >3 FB Neck ROM: Full    Dental no notable dental hx.    Pulmonary neg pulmonary ROS,    Pulmonary exam normal breath sounds clear to auscultation       Cardiovascular negative cardio ROS Normal cardiovascular exam Rhythm:Regular Rate:Normal     Neuro/Psych negative neurological ROS  negative psych ROS   GI/Hepatic negative GI ROS, Neg liver ROS,   Endo/Other  negative endocrine ROS  Renal/GU negative Renal ROS  negative genitourinary   Musculoskeletal negative musculoskeletal ROS (+)   Abdominal (+) + obese,   Peds negative pediatric ROS (+)  Hematology negative hematology ROS (+)   Anesthesia Other Findings   Reproductive/Obstetrics (+) Pregnancy                             Anesthesia Physical Anesthesia Plan  ASA: II  Anesthesia Plan: Epidural   Post-op Pain Management:    Induction:   PONV Risk Score and Plan:   Airway Management Planned:   Additional Equipment:   Intra-op Plan:   Post-operative Plan:   Informed Consent:   Plan Discussed with:   Anesthesia Plan Comments:         Anesthesia Quick Evaluation  

## 2018-10-25 NOTE — Progress Notes (Signed)
OB/GYN Faculty Practice: Labor Progress Note  Subjective:  Contractions strengthening and patient in moderate pain   Objective:  BP 105/62   Pulse 82   Temp 98.5 F (36.9 C) (Oral)   Resp 15   Ht 5\' 8"  (2.263 m)   Wt 100 kg   LMP 01/10/2018 (Exact Date)   BMI 33.53 kg/m  Gen: Lying in bed comfortably and moderate distress with contractions.  Extremities: No signs of DVT.   CE: Dilation: Fingertip Effacement (%): 50 Station: Ballotable Presentation: Vertex Exam by:: Mortimer Fries, RN Contractions: Regular, q 46minutes  FH: BL 120, mod var, + a, -d.   Assessment and Plan:  Gina Harrison is a 28 y.o. G1P0 at [redacted]w[redacted]d - PDIOL  Labor: cytotec x 1 (0145). CE more favorable from previous exam. Softer, but still thick. FB placed. As pt is contracting well, will not place cytotec nor pitocin.  . Pain control: Epidural and IV pain meds . Anticipated MOD: NSVD  Fetal Wellbeing: Cat I tracing . GBS Positive (05/28 0423) > PCN . Continuous fetal monitoring  Zettie Cooley, M.D.  Family Medicine  PGY-1 10/25/2018 4:48 AM

## 2018-10-26 LAB — CBC
HCT: 21.3 % — ABNORMAL LOW (ref 36.0–46.0)
HCT: 21.7 % — ABNORMAL LOW (ref 36.0–46.0)
Hemoglobin: 6.3 g/dL — CL (ref 12.0–15.0)
Hemoglobin: 6.4 g/dL — CL (ref 12.0–15.0)
MCH: 22.7 pg — ABNORMAL LOW (ref 26.0–34.0)
MCH: 22.6 pg — ABNORMAL LOW (ref 26.0–34.0)
MCHC: 29.6 g/dL — ABNORMAL LOW (ref 30.0–36.0)
MCHC: 29.5 g/dL — ABNORMAL LOW (ref 30.0–36.0)
MCV: 76.6 fL — ABNORMAL LOW (ref 80.0–100.0)
MCV: 76.7 fL — ABNORMAL LOW (ref 80.0–100.0)
Platelets: 161 10*3/uL (ref 150–400)
Platelets: 172 10*3/uL (ref 150–400)
RBC: 2.78 MIL/uL — ABNORMAL LOW (ref 3.87–5.11)
RBC: 2.83 MIL/uL — ABNORMAL LOW (ref 3.87–5.11)
RDW: 17.3 % — ABNORMAL HIGH (ref 11.5–15.5)
RDW: 17.7 % — ABNORMAL HIGH (ref 11.5–15.5)
WBC: 11.9 10*3/uL — ABNORMAL HIGH (ref 4.0–10.5)
WBC: 11.3 10*3/uL — ABNORMAL HIGH (ref 4.0–10.5)
nRBC: 0.3 % — ABNORMAL HIGH (ref 0.0–0.2)
nRBC: 0.3 % — ABNORMAL HIGH (ref 0.0–0.2)

## 2018-10-26 LAB — ABO/RH: ABO/RH(D): O POS

## 2018-10-26 NOTE — Progress Notes (Signed)
Postpartum Day 1  Subjective Up ad lib, voiding, tolerating PO, and + flatus. Denies dizziness, lightheadedness, tachycardia. Appropriate Lochia. Pain well controlled.  Objective BP 121/81 (BP Location: Right Arm)   Pulse 84   Temp 98.8 F (37.1 C) (Oral)   Resp 18   Ht 5\' 8"  (1.727 m)   Wt 100 kg   LMP 01/10/2018 (Exact Date)   SpO2 100%   Breastfeeding Unknown   BMI 33.53 kg/m  Intake/Output      06/28 0701 - 06/29 0700 06/29 0701 - 06/30 0700   I.V. (mL/kg) 0 (0)    Other 0    Total Intake(mL/kg) 0 (0)    Urine (mL/kg/hr) 200 (0.1)    Blood 629    Total Output 829    Net -829           Physical Exam:  General: alert, cooperative, NAD Lochia: appropriate Uterine Fundus: firm DVT Evaluation: No evidence of DVT seen on physical exam, extremities warm and well perfused.   Recent Labs    10/25/18 0050 10/26/18 0445  HGB 7.2* 6.3*  HCT 24.4* 21.3*    Assessment & Plan Postpartum Day # 1 . Plan for d/c tomorrow  . Feed: both, continue daily lactation consult . Contraception: depo prior to discharge     Anemia  Hgb 6.3 - patient would like to avoid transfusion and is not currently symptomatic.    LOS: 1 day   Wilber Oliphant 10/26/2018, 7:30 AM        Provider attestation I have seen and examined this patient and agree with above documentation in the resident's note.   Post Partum Day 1  Gina Harrison is a 28 y.o. G1P1001 s/p SVD.  Pt denies problems with ambulating, voiding or po intake. Pain is well controlled. Method of Feeding: breast & bottle  PE:  Gen: well appearing Heart: reg rate Lungs: normal WOB Fundus firm Ext: soft, no pain, no edema  Assessment: S/p SVD PPD #1  Plan for discharge: tomorrow -Pt requesting CBC recheck today. Would like to avoid blood transfusion. Currently asymptomatic. May be agreeable to feraheme infusion.   Jorje Guild, NP 11:43 AM

## 2018-10-26 NOTE — Lactation Note (Signed)
This note was copied from a baby's chart. Lactation Consultation Note Baby 25 hrs old. Baby fed well the last feeding. Mom stated she was going to breast/formula feed when she gets home. Encouraged mom to BF first before giving formula. Taught mom how to hand express w/dot of colostrum noted. Encouraged mom to finger stimulate nipples to evert more before latching.  Newborn feeding habits, STS, I&O, positioning, support, breast massage, supply and demand reviewed. Mom has everted nipples. Encouraged to call for assistance if needed. Lactation brochure given.  Patient Name: Gina Harrison Today's Date: 10/26/2018 Reason for consult: Initial assessment;1st time breastfeeding;Term   Maternal Data Has patient been taught Hand Expression?: Yes Does the patient have breastfeeding experience prior to this delivery?: No  Feeding Feeding Type: Breast Fed  LATCH Score Latch: Grasps breast easily, tongue down, lips flanged, rhythmical sucking.  Audible Swallowing: A few with stimulation  Type of Nipple: Everted at rest and after stimulation  Comfort (Breast/Nipple): Soft / non-tender  Hold (Positioning): No assistance needed to correctly position infant at breast.  LATCH Score: 9  Interventions Interventions: Breast feeding basics reviewed;Support pillows;Breast massage;Hand express;Breast compression  Lactation Tools Discussed/Used WIC Program: No   Consult Status Consult Status: Follow-up Date: 10/27/18 Follow-up type: In-patient    Timarion Agcaoili, Elta Guadeloupe 10/26/2018, 1:01 AM

## 2018-10-26 NOTE — Anesthesia Postprocedure Evaluation (Signed)
Anesthesia Post Note  Patient: Gina Harrison  Procedure(s) Performed: AN AD HOC LABOR EPIDURAL     Patient location during evaluation: Mother Baby Anesthesia Type: Epidural Level of consciousness: awake and alert and oriented Pain management: satisfactory to patient Vital Signs Assessment: post-procedure vital signs reviewed and stable Respiratory status: respiratory function stable Cardiovascular status: stable Postop Assessment: no headache, no backache, epidural receding, patient able to bend at knees, no signs of nausea or vomiting and adequate PO intake Anesthetic complications: no    Last Vitals:  Vitals:   10/26/18 0202 10/26/18 0532  BP: 109/71 121/81  Pulse: 90 84  Resp: 16 18  Temp: 37.2 C 37.1 C  SpO2: 100% 100%    Last Pain:  Vitals:   10/26/18 0532  TempSrc: Oral  PainSc:    Pain Goal:                   Gina Harrison

## 2018-10-27 MED ORDER — IBUPROFEN 600 MG PO TABS: 600.0000 mg | ORAL_TABLET | Freq: Four times a day (QID) | ORAL | 0 refills | Status: DC

## 2018-10-27 MED ORDER — SODIUM CHLORIDE 0.9 % IV SOLN
510.0000 mg | Freq: Once | INTRAVENOUS | Status: AC
  Administered 2018-10-27: 510 mg via INTRAVENOUS
  Filled 2018-10-27: qty 17

## 2018-10-27 MED ORDER — MEDROXYPROGESTERONE ACETATE 150 MG/ML IM SUSP
150.0000 mg | Freq: Once | INTRAMUSCULAR | Status: AC
  Administered 2018-10-27: 150 mg via INTRAMUSCULAR
  Filled 2018-10-27: qty 1

## 2018-10-27 MED ORDER — ACETAMINOPHEN 325 MG PO TABS: 650.0000 mg | ORAL_TABLET | ORAL | Status: DC | PRN

## 2018-10-27 NOTE — Discharge Instructions (Signed)

## 2018-10-27 NOTE — Lactation Note (Signed)
This note was copied from a baby's chart. Lactation Consultation Note  Patient Name: Girl Atira Little-Faison Today's Date: 10/27/2018   Baby 44 hours old.  Baby had light meconium at birth. Baby was given formula and is now sleeping on FOB chest so unable to evaluate feeding now.  Suggest to call [facility] if further help is needed.  Mother states she has only been breastfeeding on one breast per feeding. Recommend breastfeeding on demand, both breasts per session if baby is able. Discussed frequency and duration.   Feed on demand approximately 8-12 times per day.   Reviewed engorgement care and monitoring voids/stools. Encouraged hand expression before feedings and supply and demand.       Maternal Data    Feeding Feeding Type: Bottle Fed - Formula Nipple Type: Slow - flow  LATCH Score                   Interventions    Lactation Tools Discussed/Used WIC Program: (plans to apply)   Consult Status      Carlye Grippe 10/27/2018, 12:04 PM

## 2018-10-27 NOTE — Lactation Note (Signed)
This note was copied from a baby's chart. Lactation Consultation Note Baby 82 hrs old. Heard baby fussing. Discussed cluster feeding. Mom stated the baby just finished feeding, she has gas. Baby had no stool > 24hrs. Discussed importance of I&O.  Mom stated she was wanting to go home today. LC encouraged mom to have BF going well before going home. Mom stated it was going well.  Mom is stand offish, quiet as if she doesn't want assistance. Encouraged mom to call for assistance if needed.   Patient Name: Gina Harrison [id number] Date: 10/27/2018 Reason for consult: Follow-up assessment;1st time breastfeeding;Term   Maternal Data    Feeding Feeding Type: Breast Fed  LATCH Score                   Interventions    Lactation Tools Discussed/Used     Consult Status Consult Status: Follow-up Date: 10/27/18 Follow-up type: In-patient    Cotton Beckley, Elta Guadeloupe 10/27/2018, 4:01 AM

## 2018-10-28 LAB — TYPE AND SCREEN
ABO/RH(D): O POS
Antibody Screen: NEGATIVE
Unit division: 0
Unit division: 0

## 2018-10-28 LAB — BPAM RBC
Blood Product Expiration Date: 202007252359
Blood Product Expiration Date: 202007252359
ISSUE DATE / TIME: 202006272143
ISSUE DATE / TIME: 202006272146
Unit Type and Rh: 5100
Unit Type and Rh: 5100

## 2018-11-30 ENCOUNTER — Other Ambulatory Visit: Payer: Self-pay

## 2018-11-30 ENCOUNTER — Ambulatory Visit (INDEPENDENT_AMBULATORY_CARE_PROVIDER_SITE_OTHER): Payer: Medicaid Other | Admitting: Obstetrics & Gynecology

## 2018-11-30 ENCOUNTER — Encounter: Payer: Self-pay | Admitting: Obstetrics & Gynecology

## 2018-11-30 VITALS — BP 127/83 | HR 76 | Temp 98.7°F | Ht 68.0 in | Wt 183.4 lb

## 2018-11-30 DIAGNOSIS — O9903 Anemia complicating the puerperium: Secondary | ICD-10-CM

## 2018-11-30 NOTE — Progress Notes (Signed)
Post Partum Exam  Gina Harrison is a 28 y.o. G19P1001 female who presents for a postpartum visit. She is 6 weeks postpartum following a spontaneous vaginal delivery. I have fully reviewed the prenatal and intrapartum course. The delivery was at 57 gestational weeks.  Anesthesia: epidural. Postpartum course has been uncomplicated. Baby's course has been unremarkable . Baby is feeding by breast. Bleeding moderate lochia and red. Bowel function is normal. Bladder function is normal. Patient is not sexually active. Contraception method is Depo-Provera injections. Postpartum depression screening:neg  The following portions of the patient's history were reviewed and updated as appropriate: allergies, current medications, past family history, past medical history, past social history, past surgical history and problem list. Last pap smear done 03/23/2018 and was Normal  Review of Systems Pertinent items are noted in HPI.    Objective:  Last menstrual period 01/10/2018, unknown if currently breastfeeding.  CONSTITUTIONAL: Well-developed, well-nourished female in no acute distress.  HENT:  Normocephalic, atraumatic EYES: Conjunctivae and EOM are normal. No scleral icterus.  NECK: Normal range of motion SKIN: Skin is warm and dry. No rash noted. Not diaphoretic.No pallor. [place]: Alert and oriented to person, place, and time. Normal coordination.     Assessment:    6 weeks postpartum exam. Pap smear not done at today's visit.  Anemia in the post partum state  Plan:   1. Contraception: Depo-Provera injections 2. CBC today 3. Follow up in: 6 weeks or as needed. For  Depo Provera 4. F/u in 1 year for annual   Baxter Gonzalez L. Harraway-Smith, M.D., Cherlynn June

## 2018-12-01 LAB — CBC
Hematocrit: 38.2 % (ref 34.0–46.6)
Hemoglobin: 12.1 g/dL (ref 11.1–15.9)
MCH: 25.2 pg — ABNORMAL LOW (ref 26.6–33.0)
MCHC: 31.7 g/dL (ref 31.5–35.7)
MCV: 79 fL (ref 79–97)
Platelets: 215 10*3/uL (ref 150–450)
RBC: 4.81 x10E6/uL (ref 3.77–5.28)
RDW: 23.9 % — ABNORMAL HIGH (ref 11.7–15.4)
WBC: 5.7 10*3/uL (ref 3.4–10.8)

## 2019-01-11 ENCOUNTER — Ambulatory Visit: Payer: Medicaid Other

## 2019-01-13 ENCOUNTER — Ambulatory Visit (INDEPENDENT_AMBULATORY_CARE_PROVIDER_SITE_OTHER): Payer: BC Managed Care – PPO

## 2019-01-13 ENCOUNTER — Other Ambulatory Visit: Payer: Self-pay

## 2019-01-13 DIAGNOSIS — Z3042 Encounter for surveillance of injectable contraceptive: Secondary | ICD-10-CM

## 2019-01-13 MED ORDER — MEDROXYPROGESTERONE ACETATE 150 MG/ML IM SUSP: 150.0000 mg | INTRAMUSCULAR | 3 refills | Status: DC

## 2019-01-13 MED ORDER — MEDROXYPROGESTERONE ACETATE 150 MG/ML IM SUSP
150.0000 mg | Freq: Once | INTRAMUSCULAR | Status: AC
  Administered 2019-01-13: 150 mg via INTRAMUSCULAR

## 2019-01-13 NOTE — Progress Notes (Signed)
Pt presents for depo inj. Pt is within her window. Inj given in RUOQ.   Next depo due 12/2-12/17. Refills sent to pharmacy.

## 2019-03-21 ENCOUNTER — Other Ambulatory Visit: Payer: Self-pay | Admitting: Obstetrics

## 2019-04-07 ENCOUNTER — Other Ambulatory Visit: Payer: Self-pay

## 2019-04-07 ENCOUNTER — Ambulatory Visit (INDEPENDENT_AMBULATORY_CARE_PROVIDER_SITE_OTHER): Payer: BC Managed Care – PPO

## 2019-04-07 DIAGNOSIS — Z3042 Encounter for surveillance of injectable contraceptive: Secondary | ICD-10-CM

## 2019-04-07 MED ORDER — MEDROXYPROGESTERONE ACETATE 150 MG/ML IM SUSP
150.0000 mg | Freq: Once | INTRAMUSCULAR | Status: AC
  Administered 2019-04-07: 11:00:00 150 mg via INTRAMUSCULAR

## 2019-04-07 NOTE — Progress Notes (Signed)
Pt is here for depo injection, she is on time. Injection given in LUOQ without difficulty. Next injection due 02/24-03/10. Pt made aware.

## 2019-06-29 ENCOUNTER — Ambulatory Visit (INDEPENDENT_AMBULATORY_CARE_PROVIDER_SITE_OTHER): Payer: BLUE CROSS/BLUE SHIELD | Admitting: *Deleted

## 2019-06-29 ENCOUNTER — Other Ambulatory Visit: Payer: Self-pay

## 2019-06-29 DIAGNOSIS — Z3042 Encounter for surveillance of injectable contraceptive: Secondary | ICD-10-CM | POA: Diagnosis not present

## 2019-06-29 MED ORDER — MEDROXYPROGESTERONE ACETATE 150 MG/ML IM SUSP
150.0000 mg | Freq: Once | INTRAMUSCULAR | Status: AC
  Administered 2019-06-29: 150 mg via INTRAMUSCULAR

## 2019-06-29 NOTE — Progress Notes (Signed)
Pt is in office for Depo injection.  Pt is on time for Depo.   Injection given, pt tolerated well.  Pt advised to RTO 5/18-09/28/2019 for next Depo.  Pt has no other concerns today.    Administrations This Visit    medroxyPROGESTERone (DEPO-PROVERA) injection 150 mg    Admin Date 06/29/2019 Action Given Dose 150 mg Route Intramuscular Administered By Lanney Gins, CMA

## 2019-06-30 NOTE — Progress Notes (Signed)
Patient seen and assessed by nursing staff during this encounter. I have reviewed the chart and agree with the documentation and plan.  Catalina Antigua, MD 06/30/2019 11:04 AM

## 2019-07-08 ENCOUNTER — Other Ambulatory Visit: Payer: Self-pay

## 2019-07-08 DIAGNOSIS — J4530 Mild persistent asthma, uncomplicated: Secondary | ICD-10-CM

## 2019-07-08 MED ORDER — ALBUTEROL SULFATE HFA 108 (90 BASE) MCG/ACT IN AERS: INHALATION_SPRAY | RESPIRATORY_TRACT | 1 refills | Status: DC

## 2019-09-22 ENCOUNTER — Ambulatory Visit: Payer: BLUE CROSS/BLUE SHIELD

## 2019-09-29 ENCOUNTER — Ambulatory Visit (INDEPENDENT_AMBULATORY_CARE_PROVIDER_SITE_OTHER): Payer: BLUE CROSS/BLUE SHIELD

## 2019-09-29 ENCOUNTER — Other Ambulatory Visit: Payer: Self-pay

## 2019-09-29 DIAGNOSIS — Z3042 Encounter for surveillance of injectable contraceptive: Secondary | ICD-10-CM

## 2019-09-29 MED ORDER — MEDROXYPROGESTERONE ACETATE 150 MG/ML IM SUSP
150.0000 mg | INTRAMUSCULAR | Status: AC
  Administered 2019-09-29: 150 mg via INTRAMUSCULAR

## 2019-09-29 NOTE — Progress Notes (Signed)
Pt is in the office for depo injection, administered in LUOQ and pt tolerated well. Next due Aug 18- Sep 1. .. Administrations This Visit    medroxyPROGESTERone (DEPO-PROVERA) injection 150 mg    Admin Date 09/29/2019 Action Given Dose 150 mg Route Intramuscular Administered By Katrina Stack, RN

## 2019-09-30 NOTE — Progress Notes (Signed)
Patient was assessed and managed by nursing staff during this encounter. I have reviewed the chart and agree with the documentation and plan. I have also made any necessary editorial changes.  Catalina Antigua, MD 09/30/2019 7:10 AM

## 2019-12-21 ENCOUNTER — Other Ambulatory Visit: Payer: Self-pay | Admitting: Obstetrics & Gynecology

## 2019-12-21 ENCOUNTER — Other Ambulatory Visit: Payer: Self-pay

## 2019-12-21 ENCOUNTER — Ambulatory Visit (INDEPENDENT_AMBULATORY_CARE_PROVIDER_SITE_OTHER): Payer: BLUE CROSS/BLUE SHIELD

## 2019-12-21 VITALS — BP 114/78 | HR 69 | Wt 179.0 lb

## 2019-12-21 DIAGNOSIS — Z3042 Encounter for surveillance of injectable contraceptive: Secondary | ICD-10-CM

## 2019-12-21 MED ORDER — MEDROXYPROGESTERONE ACETATE 150 MG/ML IM SUSP
150.0000 mg | Freq: Once | INTRAMUSCULAR | Status: AC
  Administered 2019-12-21: 150 mg via INTRAMUSCULAR

## 2019-12-21 NOTE — Progress Notes (Signed)
GYN presents for DEPO injection, given in RUOQ, tolerated well.  Next DEPO due Nov. 9-23, 2021  Administrations This Visit    medroxyPROGESTERone (DEPO-PROVERA) injection 150 mg    Admin Date 12/21/2019 Action Given Dose 150 mg Route Intramuscular Administered By Maretta Bees, RMA

## 2019-12-22 NOTE — Progress Notes (Signed)
Patient was assessed and managed by nursing staff during this encounter. I have reviewed the chart and agree with the documentation and plan. I have also made any necessary editorial changes.  Jaynie Collins, MD 12/22/2019 12:35 PM

## 2020-01-11 ENCOUNTER — Ambulatory Visit (INDEPENDENT_AMBULATORY_CARE_PROVIDER_SITE_OTHER): Payer: BLUE CROSS/BLUE SHIELD | Admitting: Obstetrics and Gynecology

## 2020-01-11 ENCOUNTER — Encounter: Payer: Self-pay | Admitting: Obstetrics and Gynecology

## 2020-01-11 ENCOUNTER — Other Ambulatory Visit (HOSPITAL_COMMUNITY)
Admission: RE | Admit: 2020-01-11 | Discharge: 2020-01-11 | Disposition: A | Discharge status: Home / Self Care | Payer: BLUE CROSS/BLUE SHIELD | Source: Ambulatory Visit | Attending: Obstetrics and Gynecology | Admitting: Obstetrics and Gynecology

## 2020-01-11 ENCOUNTER — Other Ambulatory Visit: Payer: Self-pay

## 2020-01-11 VITALS — BP 133/88 | HR 86 | Ht 68.0 in | Wt 175.0 lb

## 2020-01-11 DIAGNOSIS — Z01419 Encounter for gynecological examination (general) (routine) without abnormal findings: Secondary | ICD-10-CM | POA: Diagnosis present

## 2020-01-11 NOTE — Patient Instructions (Signed)

## 2020-01-11 NOTE — Progress Notes (Signed)
GYN presents for AEX/PAP/STD screening.  Needs refills on DEPO.  She is trying to wean her baby off the Breast and wants milk to dry up.

## 2020-01-11 NOTE — Progress Notes (Signed)
GYNECOLOGY ANNUAL PREVENTATIVE CARE ENCOUNTER NOTE  Subjective:   Gina Harrison is a 29 y.o. G23P1001 female here for a routine annual gynecologic exam.  Current complaints: none.   Denies abnormal vaginal bleeding, discharge, pelvic pain, problems with intercourse or other gynecologic concerns. Patient considering changing from Depo for contraption.  As a teenager, she experienced a superficial blood clot below her left knee shortly after the Implanon insertion.     Gynecologic History No LMP recorded. Patient is currently sexually active  Contraception: Depo-Provera injections Last Pap was 03/23/2018. Results were: normal without high risk HPV testing   Obstetric History OB History  Gravida Para Term Preterm AB Living  1 1 1     1   SAB TAB Ectopic Multiple Live Births        0 1    # Outcome Date GA Lbr Len/2nd Weight Sex Delivery Anes PTL Lv  1 Term 10/25/18 [redacted]w[redacted]d 07:10 / 00:30 9 lb 0.3 oz (4.09 kg) F Vag-Spont EPI  LIV     Birth Comments: Right arm noted to have weakness, lack of muscle tone and minimal movement. Right hand postured. Peds notified    Past Medical History:  Diagnosis Date  . Angio-edema   . Mild persistent asthma without complication 09/03/2016    Past Surgical History:  Procedure Laterality Date  . TONSILLECTOMY    . TYMPANOSTOMY TUBE PLACEMENT      Current Outpatient Medications on File Prior to Visit  Medication Sig Dispense Refill  . acetaminophen (TYLENOL) 325 MG tablet Take 2 tablets (650 mg total) by mouth every 4 (four) hours as needed (for pain scale < 4). (Patient not taking: Reported on 09/29/2019)    . albuterol (VENTOLIN HFA) 108 (90 Base) MCG/ACT inhaler 2 puffs every 4 hours as needed for coughing or wheezing spells (Patient not taking: Reported on 09/29/2019) 16 g 1  . EPINEPHrine (EPIPEN 2-PAK) 0.3 mg/0.3 mL IJ SOAJ injection Use as directed for severe allergic reactions (Patient not taking: Reported on 09/29/2019) 1 Device 1  .  ibuprofen (ADVIL) 600 MG tablet Take 1 tablet (600 mg total) by mouth every 6 (six) hours. (Patient not taking: Reported on 09/29/2019) 30 tablet 0  . medroxyPROGESTERone (DEPO-PROVERA) 150 MG/ML injection Inject 1 mL (150 mg total) into the muscle every 3 (three) months. (Patient not taking: Reported on 09/29/2019) 1 mL 3  . Prenatal Vit-Fe Phos-FA-Omega (VITAFOL GUMMIES) 3.33-0.333-34.8 MG CHEW CHEW AND SWALLOW 3 TABLETS BY MOUTH DAILY (Patient not taking: Reported on 09/29/2019) 90 tablet 12   Current Facility-Administered Medications on File Prior to Visit  Medication Dose Route Frequency Provider Last Rate Last Admin  . medroxyPROGESTERone (DEPO-PROVERA) injection 150 mg  150 mg Intramuscular Q90 days Constant, Peggy, MD   150 mg at 09/29/19 1347    Allergies  Allergen Reactions  . Apple   . Carrot Oil   . Celery Oil   . Kiwi Extract   . Latex Itching  . Peach [Prunus Persica]   . Peanut-Containing Drug Products     Tree nuts    Social History   Socioeconomic History  . Marital status: Single    Spouse name: Not on file  . Number of children: Not on file  . Years of education: Not on file  . Highest education level: Not on file  Occupational History  . Not on file  Tobacco Use  . Smoking status: Never Smoker  . Smokeless tobacco: Never Used  Vaping Use  .  Vaping Use: Never used  Substance and Sexual Activity  . Alcohol use: Not Currently    Comment: occ  . Drug use: Never  . Sexual activity: Not Currently    Birth control/protection: None  Other Topics Concern  . Not on file  Social History Narrative  . Not on file   Social Determinants of Health   Financial Resource Strain:   . Difficulty of Paying Living Expenses: Not on file  Food Insecurity:   . Worried About Programme researcher, broadcasting/film/video in the Last Year: Not on file  . Ran Out of Food in the Last Year: Not on file  Transportation Needs:   . Lack of Transportation (Medical): Not on file  . Lack of Transportation  (Non-Medical): Not on file  Physical Activity:   . Days of Exercise per Week: Not on file  . Minutes of Exercise per Session: Not on file  Stress:   . Feeling of Stress : Not on file  Social Connections:   . Frequency of Communication with Friends and Family: Not on file  . Frequency of Social Gatherings with Friends and Family: Not on file  . Attends Religious Services: Not on file  . Active Member of Clubs or Organizations: Not on file  . Attends Banker Meetings: Not on file  . Marital Status: Not on file  Intimate Partner Violence:   . Fear of Current or Ex-Partner: Not on file  . Emotionally Abused: Not on file  . Physically Abused: Not on file  . Sexually Abused: Not on file    Family History  Problem Relation Age of Onset  . Allergic rhinitis Maternal Grandfather   . Hypertension Mother   . Angioedema Neg Hx   . Asthma Neg Hx   . Eczema Neg Hx   . Immunodeficiency Neg Hx   . Urticaria Neg Hx     The following portions of the patient's history were reviewed and updated as appropriate: allergies, current medications, past family history, past medical history, past social history, past surgical history and problem list.  Review of Systems Pertinent items noted in HPI and remainder of comprehensive ROS otherwise negative.   Objective:  BP 133/88   Pulse 86   Wt 175 lb (79.4 kg)   BMI 26.61 kg/m  Wt Readings from Last 3 Encounters:  01/11/20 175 lb (79.4 kg)  12/21/19 179 lb (81.2 kg)  11/30/18 183 lb 6.4 oz (83.2 kg)     Chaperone present during exam  CONSTITUTIONAL: Well-developed, well-nourished female in no acute distress.  HENT:  Normocephalic, atraumatic, External right and left ear normal. Oropharynx is clear and moist EYES: Conjunctivae and EOM are normal. Pupils are equal, round, and reactive to light. No scleral icterus.  NECK: Normal range of motion, supple, no masses.  Normal thyroid.   CARDIOVASCULAR: Normal heart rate noted, regular  rhythm RESPIRATORY: Clear to auscultation bilaterally. Effort and breath sounds normal, no problems with respiration noted. BREASTS: Symmetric in size. No masses, skin changes, nipple drainage, or lymphadenopathy. ABDOMEN: Soft, normal bowel sounds, no distention noted.  No tenderness, rebound or guarding.  PELVIC: Normal appearing external genitalia; normal appearing vaginal mucosa and cervix.  No abnormal discharge noted.  Normal uterine size, no other palpable masses, no uterine or adnexal tenderness. MUSCULOSKELETAL: Normal range of motion. No tenderness.  No cyanosis, clubbing, or edema.  2+ distal pulses. SKIN: Skin is warm and dry. No rash noted. Not diaphoretic. No erythema. No pallor. NEUROLOGIC: Alert and oriented  to person, place, and time. Normal reflexes, muscle tone coordination. No cranial nerve deficit noted. PSYCHIATRIC: Normal mood and affect. Normal behavior. Normal judgment and thought content.  Assessment:  Annual gynecologic examination with pap smear   Plan:  1. Well Woman Exam Will follow up results of pap smear and manage accordingly. STD testing discussed. Patient requested testing Discussed exercise and diet Calcium and Vitamin D supplementation discussed  1. Well woman exam with routine gynecological exam - We will fu on pap smear and genital cultures. - Patient encouraged to take a daily multi-vitamin. - Patient to return in 2 months for copper IUD insertion. - Will draw fasting annual labs, including a Vitamin D level at this next appointment.     Routine preventative health maintenance measures emphasized. Please refer to After Visit Summary for other counseling recommendations.    Candelaria Celeste, DO [facility] [employer]

## 2020-01-12 LAB — CERVICOVAGINAL ANCILLARY ONLY
Bacterial Vaginitis (gardnerella): NEGATIVE
Candida Glabrata: NEGATIVE
Candida Vaginitis: NEGATIVE
Chlamydia: NEGATIVE
Comment: NEGATIVE
Comment: NEGATIVE
Comment: NEGATIVE
Comment: NEGATIVE
Comment: NEGATIVE
Comment: NORMAL
Neisseria Gonorrhea: NEGATIVE
Trichomonas: NEGATIVE

## 2020-01-13 LAB — CYTOLOGY - PAP
Comment: NEGATIVE
Diagnosis: NEGATIVE
High risk HPV: NEGATIVE

## 2020-03-07 ENCOUNTER — Other Ambulatory Visit: Payer: Self-pay

## 2020-03-07 ENCOUNTER — Encounter: Payer: Self-pay | Admitting: Obstetrics and Gynecology

## 2020-03-07 ENCOUNTER — Ambulatory Visit (INDEPENDENT_AMBULATORY_CARE_PROVIDER_SITE_OTHER): Payer: BLUE CROSS/BLUE SHIELD | Admitting: Obstetrics and Gynecology

## 2020-03-07 ENCOUNTER — Ambulatory Visit: Payer: BLUE CROSS/BLUE SHIELD | Admitting: Obstetrics and Gynecology

## 2020-03-07 VITALS — BP 113/78 | HR 80 | Wt 163.0 lb

## 2020-03-07 DIAGNOSIS — Z30430 Encounter for insertion of intrauterine contraceptive device: Secondary | ICD-10-CM | POA: Diagnosis not present

## 2020-03-07 LAB — POCT URINE PREGNANCY: Preg Test, Ur: NEGATIVE

## 2020-03-07 MED ORDER — PARAGARD INTRAUTERINE COPPER IU IUD: INTRAUTERINE_SYSTEM | Freq: Once | INTRAUTERINE | Status: AC

## 2020-03-07 NOTE — Progress Notes (Signed)
IUD Procedure Note Patient identified, informed consent performed, signed copy in chart, time out was performed.  Urine pregnancy test negative.  Speculum placed in the vagina.  Cervix visualized.  Cleaned with Betadine x 2.  Grasped anteriorly with a single tooth tenaculum.  Uterus sounded to 6 cm.  Paragard  IUD placed per manufacturer's recommendations.  Strings trimmed to 3 cm. Tenaculum was removed, good hemostasis noted.  Patient tolerated procedure well.   Patient given post procedure instructions and Paragard care card with expiration date.  Patient is asked to check IUD strings periodically and follow up in 4-6 weeks for IUD check.                  Darrin Nipper. Gerri Spore, MD

## 2020-03-07 NOTE — Progress Notes (Signed)
Pt presents for Paragard IUD insertion  Pt requests STD blood work  Denies unprotected IC x 14 days  UPT negative  01/11/20 GC neg 01/11/20 CT neg

## 2020-03-07 NOTE — Patient Instructions (Signed)

## 2020-03-08 LAB — RPR: RPR Ser Ql: NONREACTIVE

## 2020-03-08 LAB — HEPATITIS C ANTIBODY: Hep C Virus Ab: <0.1 s/co ratio (ref 0.0–0.9)

## 2020-03-08 LAB — HIV ANTIBODY (ROUTINE TESTING W REFLEX): HIV Screen 4th Generation wRfx: NONREACTIVE

## 2020-03-08 LAB — HEPATITIS B SURFACE ANTIGEN: Hepatitis B Surface Ag: NEGATIVE

## 2020-03-15 ENCOUNTER — Ambulatory Visit: Payer: BLUE CROSS/BLUE SHIELD

## 2020-03-28 ENCOUNTER — Ambulatory Visit (INDEPENDENT_AMBULATORY_CARE_PROVIDER_SITE_OTHER): Payer: BLUE CROSS/BLUE SHIELD | Admitting: Obstetrics and Gynecology

## 2020-03-28 ENCOUNTER — Other Ambulatory Visit (HOSPITAL_COMMUNITY)
Admission: RE | Admit: 2020-03-28 | Discharge: 2020-03-28 | Disposition: A | Discharge status: Home / Self Care | Payer: BLUE CROSS/BLUE SHIELD | Source: Ambulatory Visit | Attending: Obstetrics and Gynecology | Admitting: Obstetrics and Gynecology

## 2020-03-28 ENCOUNTER — Other Ambulatory Visit: Payer: Self-pay

## 2020-03-28 ENCOUNTER — Encounter: Payer: Self-pay | Admitting: Obstetrics and Gynecology

## 2020-03-28 VITALS — BP 121/79 | HR 79 | Ht 68.0 in | Wt 168.0 lb

## 2020-03-28 DIAGNOSIS — Z30431 Encounter for routine checking of intrauterine contraceptive device: Secondary | ICD-10-CM | POA: Diagnosis not present

## 2020-03-28 DIAGNOSIS — N898 Other specified noninflammatory disorders of vagina: Secondary | ICD-10-CM | POA: Diagnosis present

## 2020-03-28 NOTE — Progress Notes (Signed)
   Subjective:    Patient ID: Gina Harrison, female    DOB: 10/13/1990, 29 y.o.   MRN: [id number]  Patient presents for string check.  She is s/p IUD insertion.  Currently, the patient is without complaints.       Review of Systems  Constitutional: Negative for chills and fever.  Gastrointestinal: Negative for abdominal pain.  Genitourinary: Negative for dyspareunia, dysuria, flank pain and hematuria.       Objective:   Physical Exam Exam conducted with a chaperone present.  Constitutional:      Appearance: Normal appearance.  Abdominal:     General: Abdomen is flat.     Palpations: Abdomen is soft.  Genitourinary:    General: Normal vulva.     Labia:        Right: No tenderness or lesion.        Left: No tenderness or lesion.      Vagina: Vaginal discharge present.     Cervix: Normal.     Comments: IUD string noted Neurological:     Mental Status: She is alert.           Assessment & Plan:

## 2020-03-28 NOTE — Progress Notes (Signed)
RGYN patient presents for IUD String Check today.  Paragard IUD inserted on 03/07/20  Pt reports Nausea however not as bad as first week w/ IUD per pt.

## 2020-03-29 LAB — CERVICOVAGINAL ANCILLARY ONLY
Bacterial Vaginitis (gardnerella): NEGATIVE
Candida Glabrata: NEGATIVE
Candida Vaginitis: NEGATIVE
Chlamydia: NEGATIVE
Comment: NEGATIVE
Comment: NEGATIVE
Comment: NEGATIVE
Comment: NEGATIVE
Comment: NEGATIVE
Comment: NORMAL
Neisseria Gonorrhea: NEGATIVE
Trichomonas: NEGATIVE

## 2020-05-05 IMAGING — US US MFM OB FOLLOW-UP
1 series · 14 of 28 positions shown · non-contrast
Comparison: none

[Series 1: us mfm ob follow-up · 14 of 60 slices shown]
[im 3/60]
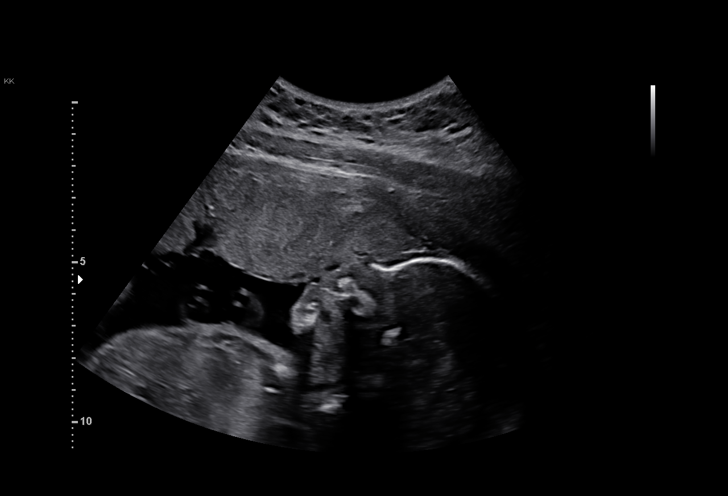
[im 7/60]
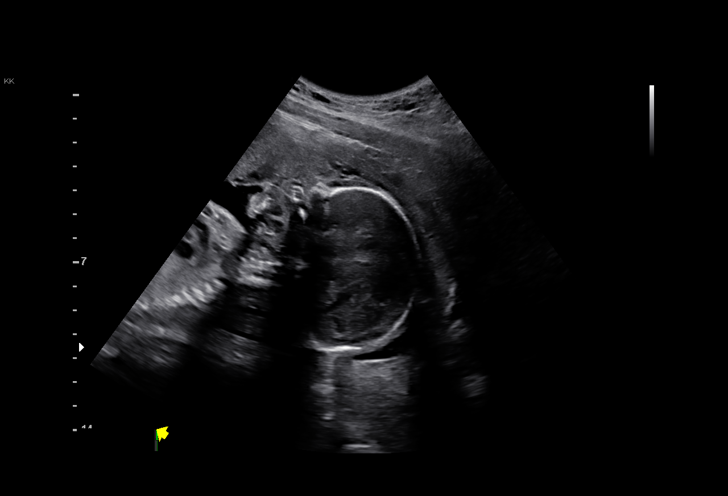
[im 11/60]
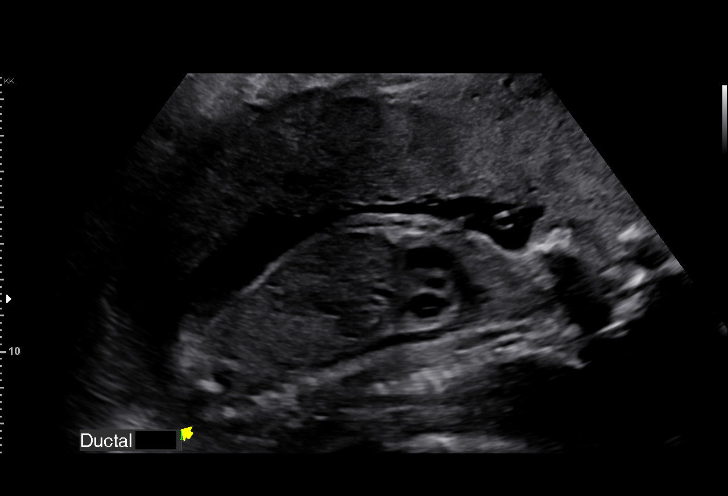
[im 16/60]
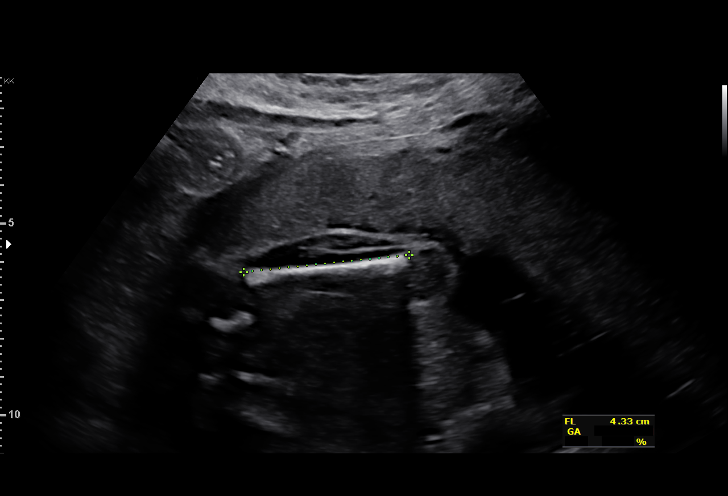
[im 20/60]
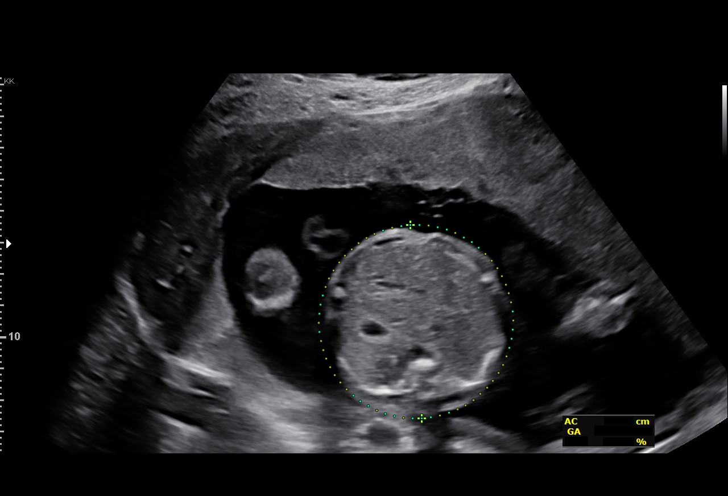
[im 25/60]
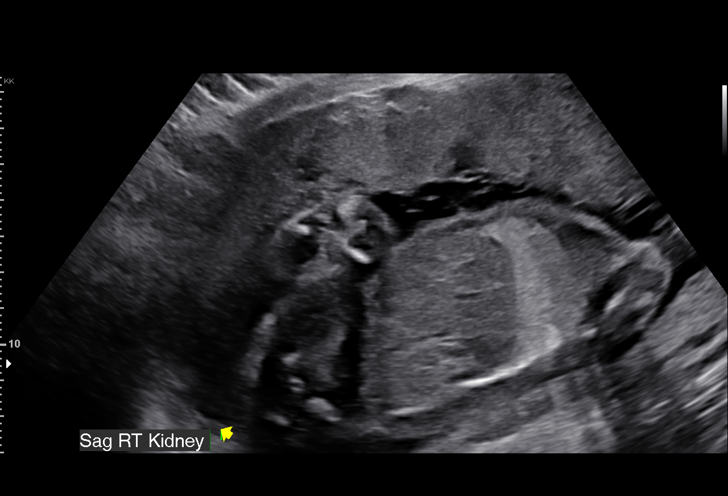
[im 29/60]
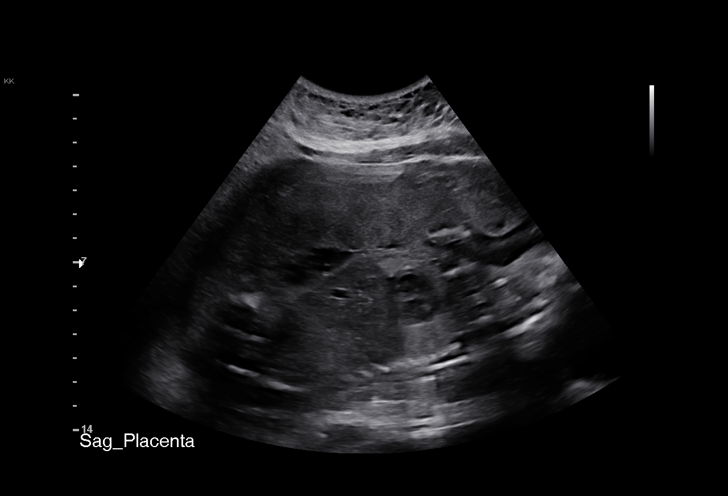
[im 33/60]
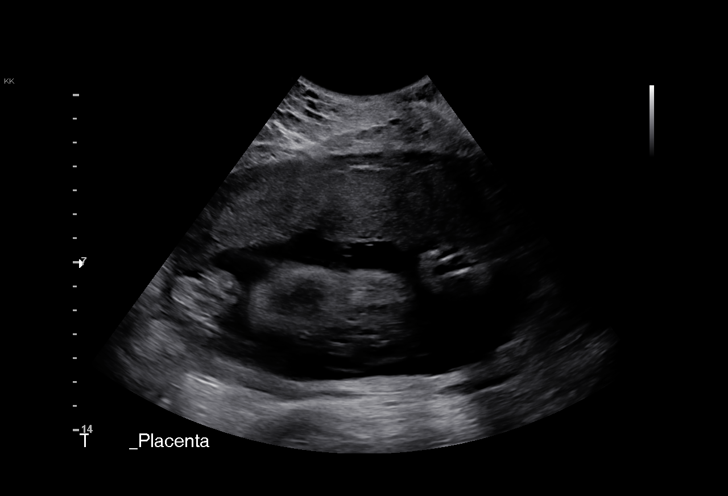
[im 38/60]
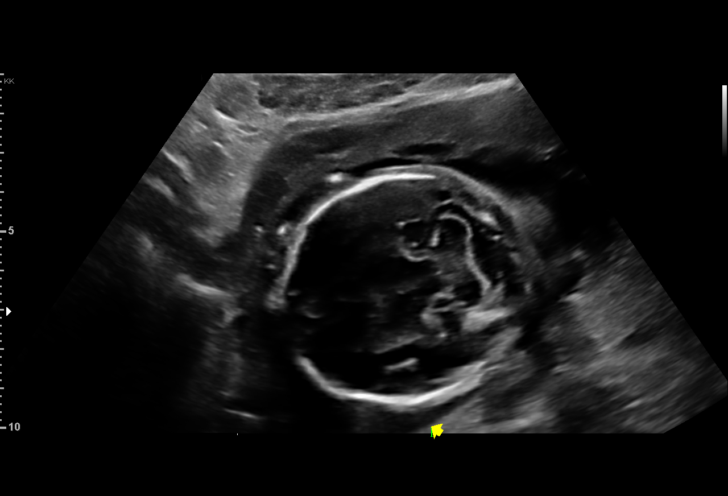
[im 42/60]
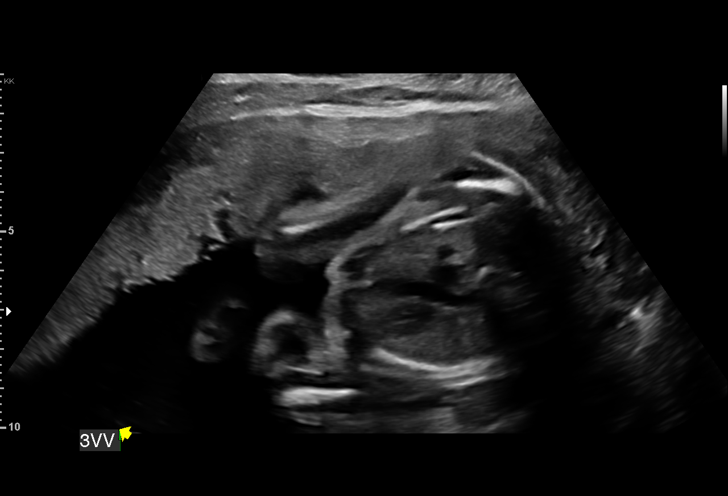
[im 46/60]
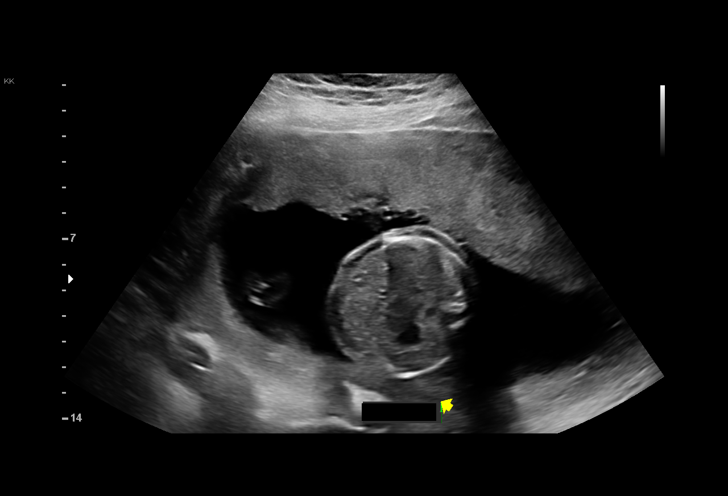
[im 51/60]
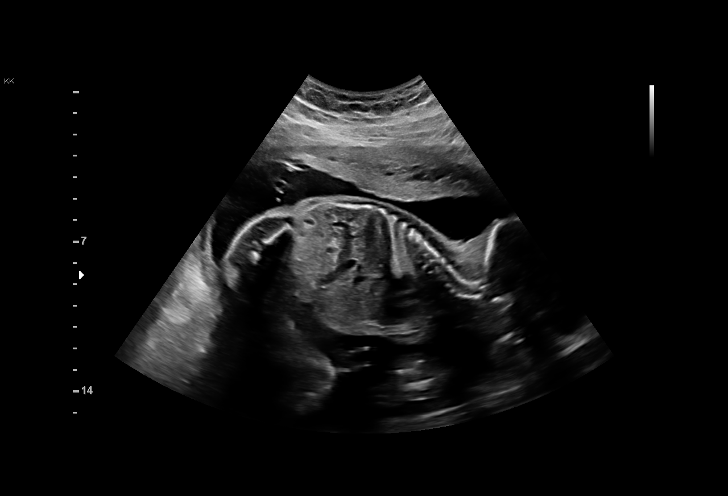
[im 55/60]
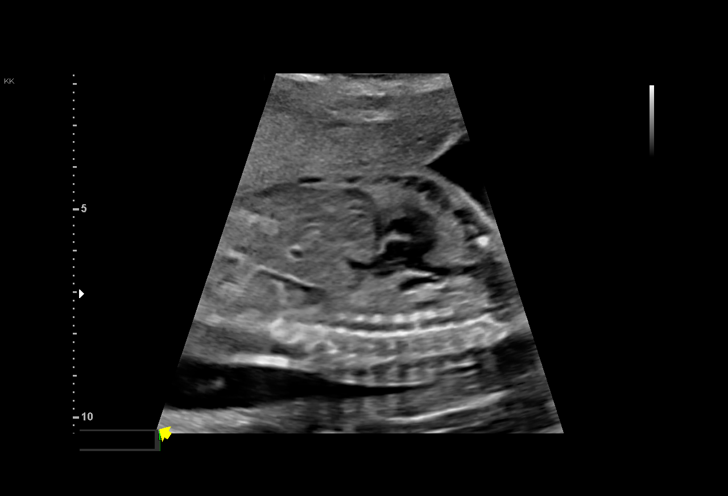
[im 60/60]
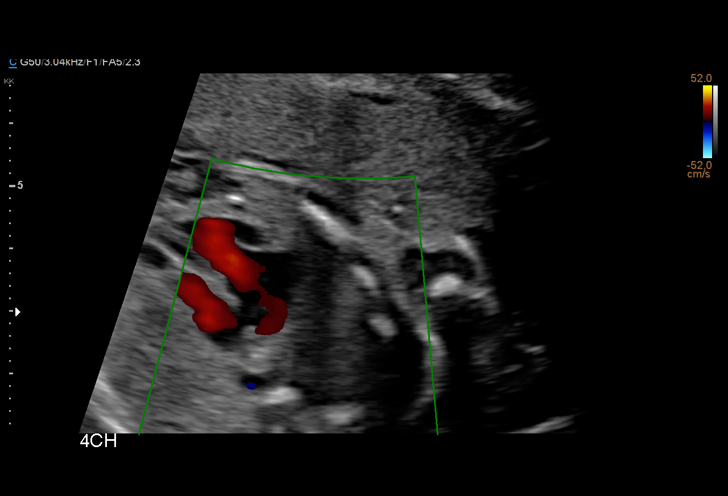

[14 of 28 positions shown; findings below may reference images not displayed]

TIGER


 ----------------------------------------------------------------------

 ----------------------------------------------------------------------
Indications

  Antenatal follow-up for nonvisualized fetal
  anatomy
  23 weeks gestation of pregnancy
 ----------------------------------------------------------------------
Fetal Evaluation

 Num Of Fetuses:         1
 Fetal Heart Rate(bpm):  152
 Cardiac Activity:       Observed
 Presentation:           Cephalic
 Placenta:               Anterior
 P. Cord Insertion:      Previously Visualized

 Amniotic Fluid
 AFI FV:      Within normal limits

                             Largest Pocket(cm)

Biometry

 BPD:      56.1  mm     G. Age:  23w 1d         33  %    CI:        75.07   %    70 - 86
                                                         FL/HC:      21.5   %    19.2 -
 HC:      205.4  mm     G. Age:  22w 4d         11  %    HC/AC:      1.06        1.05 -
 AC:      194.5  mm     G. Age:  24w 1d         64  %    FL/BPD:     78.8   %    71 - 87
 FL:       44.2  mm     G. Age:  24w 4d         74  %    FL/AC:      22.7   %    20 - 24

 Est. FW:     660  gm      1 lb 7 oz     64  %
Gestational Age
 LMP:           23w 3d        Date:  01/10/18                 EDD:   10/17/18
 U/S Today:     23w 4d                                        EDD:   10/16/18
 Best:          23w 3d     Det. By:  LMP  (01/10/18)          EDD:   10/17/18
Anatomy

 Cranium:               Appears normal         Aortic Arch:            Appears normal
 Cavum:                 Previously seen        Ductal Arch:            Appears normal
 Ventricles:            Previously seen        Diaphragm:              Previously seen
 Choroid Plexus:        Previously seen        Stomach:                Appears normal, left
                                                                       sided
 Cerebellum:            Appears normal         Abdomen:                Persistent right
                                                                       umbilical vein
 Posterior Fossa:       Previously seen        Abdominal Wall:         Previously seen
 Nuchal Fold:           Previously seen        Cord Vessels:           Previously seen
 Face:                  Appears normal         Kidneys:                Appear normal
                        (orbits and profile)
 Lips:                  Previously seen        Bladder:                Appears normal
 Thoracic:              Appears normal         Spine:                  Previously seen
 Heart:                 Appears normal         Upper Extremities:      Previously seen
                        (4CH, axis, and situs
 RVOT:                  Appears normal         Lower Extremities:      Previously seen
 LVOT:                  Appears normal

 Other:  Technically difficult due to fetal position.
Cervix Uterus Adnexa

 Cervix
 Length:            3.8  cm.
 Normal appearance by transabdominal scan.
Impression

 Amniotic fluid is normal and good fetal activity is seen. Fetal
 growth is appropriate for gestational age. Persistent right
 umbilical vein is seen again.  Cardiac anatomy appears
 normal.
Recommendations

 An appointment was made for her to return in 4 weeks for
 fetal growth assessment.
                 Leonard, Isimeli

## 2020-06-02 IMAGING — US US MFM OB FOLLOW UP
1 series · 15 of 28 positions shown · non-contrast
Comparison: none

[Series 1: us mfm ob follow up · 47 acquisitions, 15 frames shown]
[im 1/47]
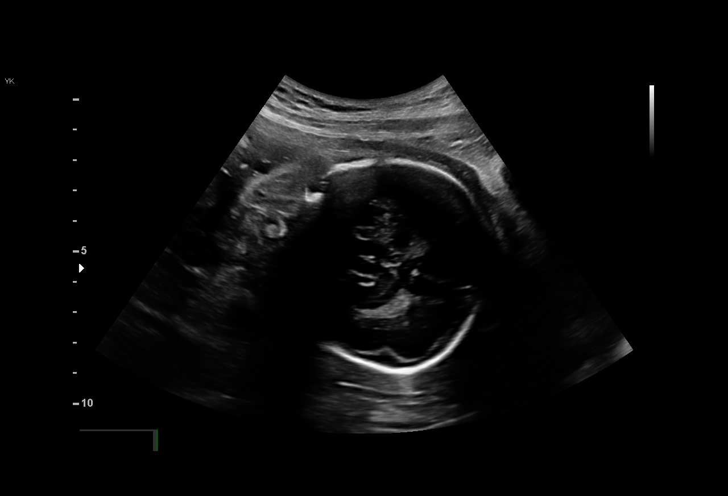
[im 4/47]
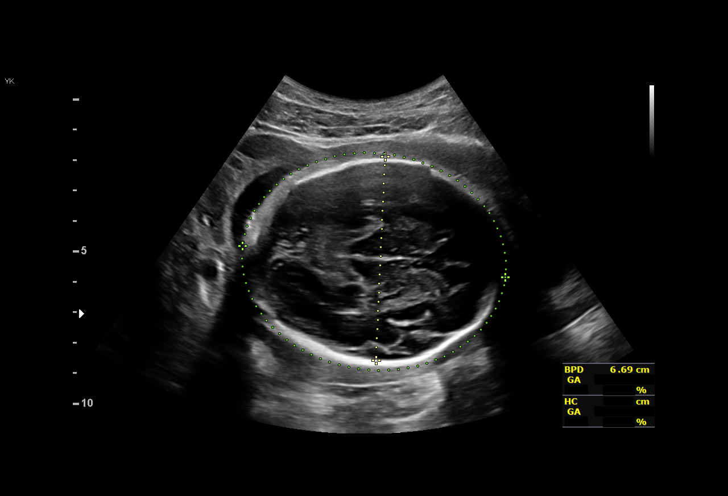
[im 7/47]
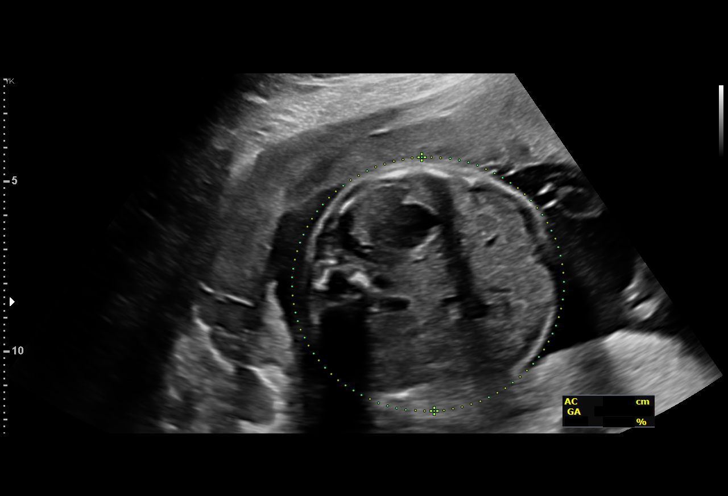
[im 11/47]
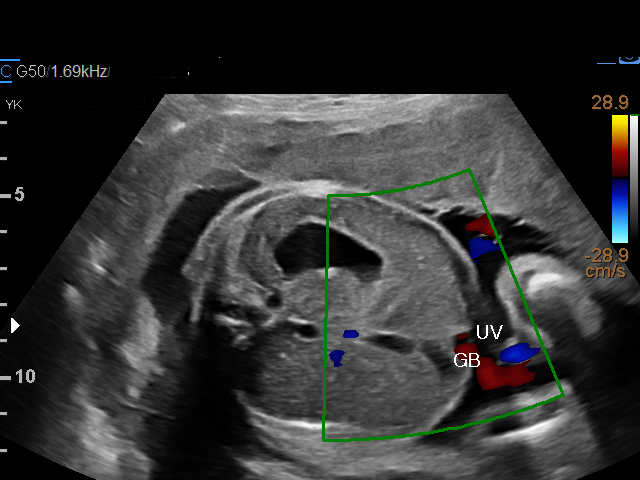
[im 14/47]
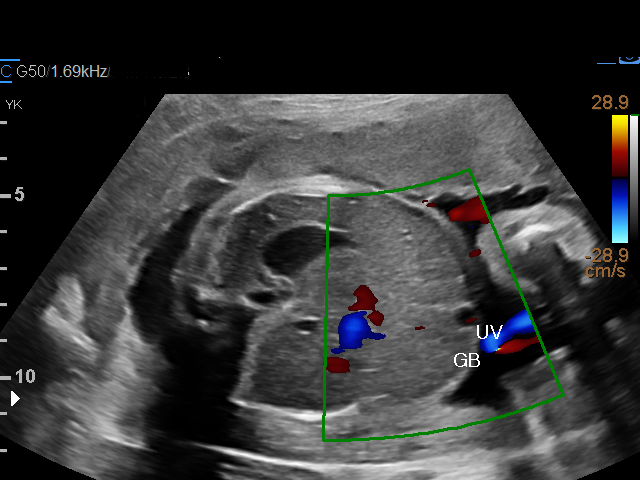
[im 18/47]
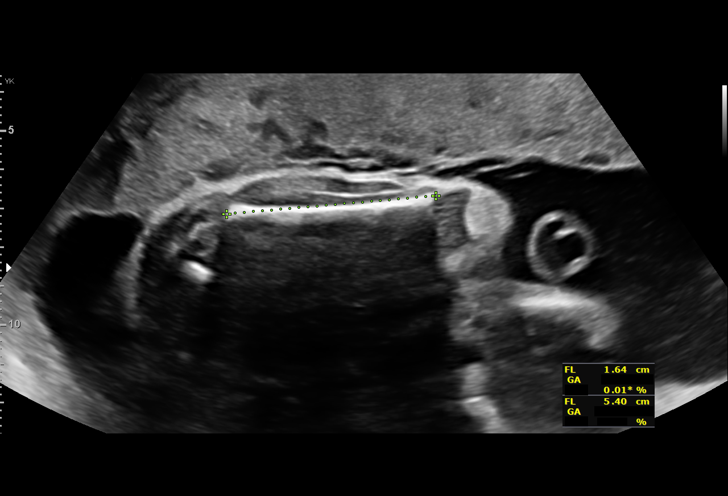
[im 21/47]
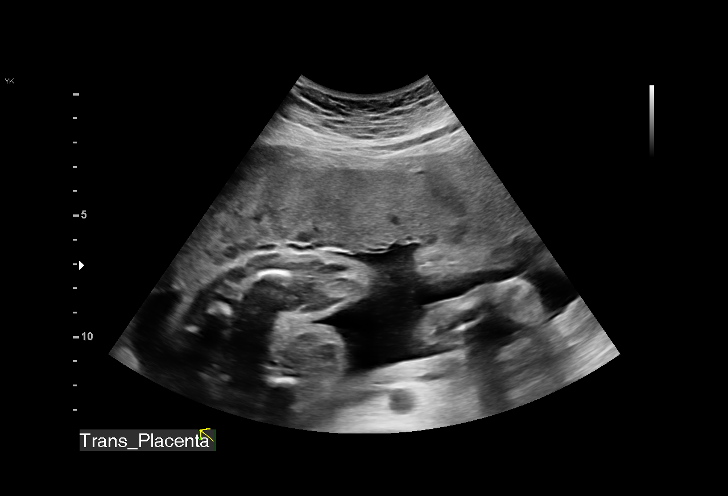
[im 24/47]
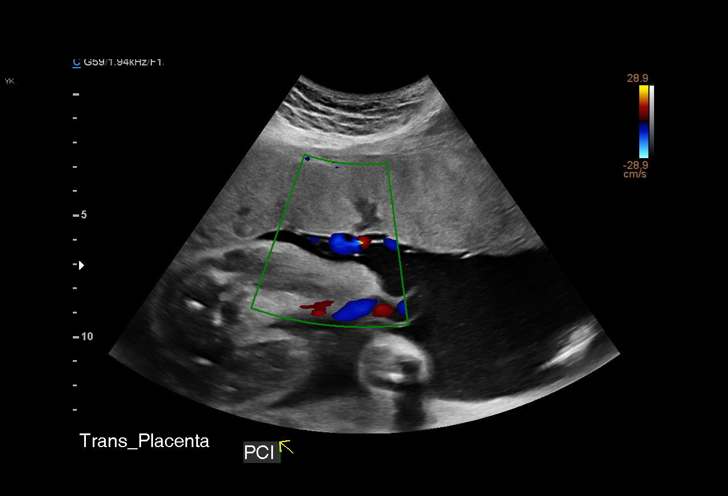
[im 26/47]
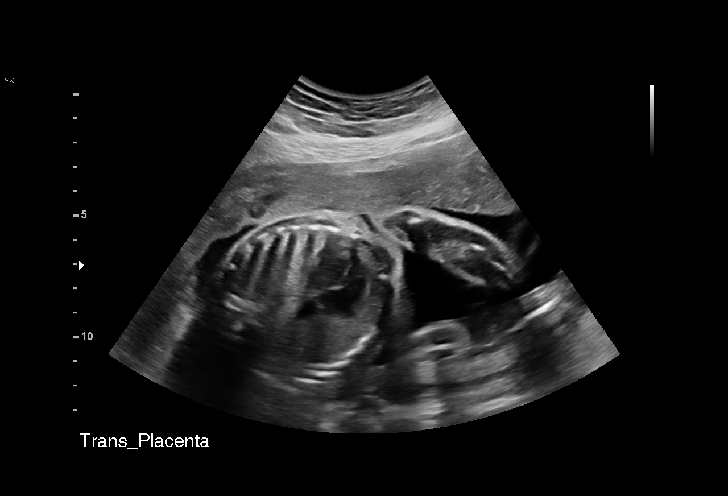
[im 29/47]
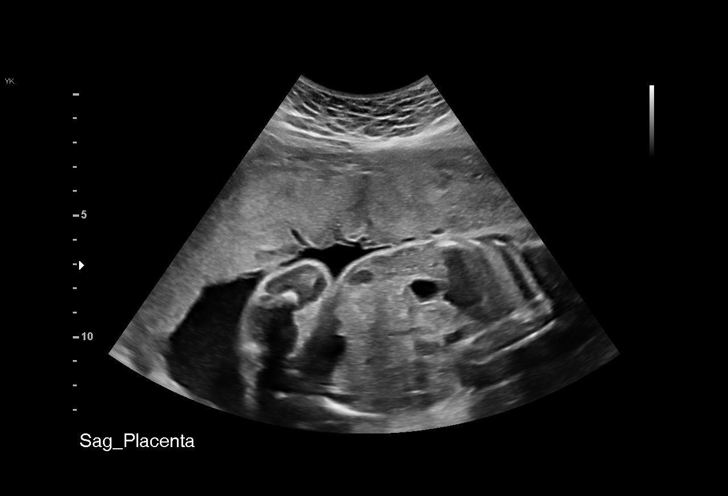
[im 33/47]
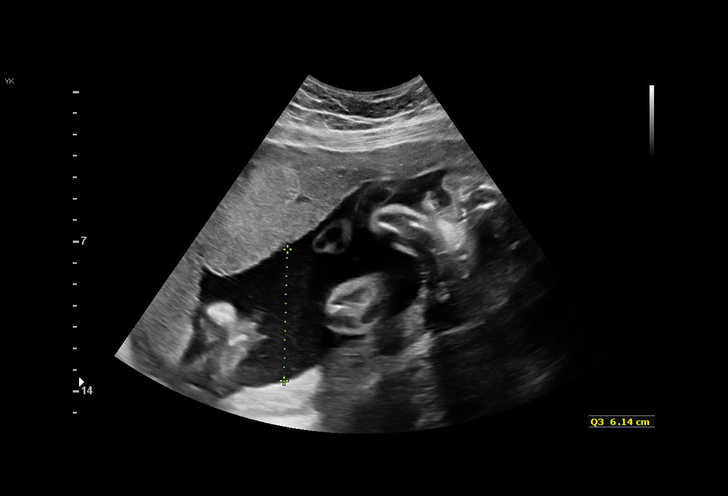
[im 36/47]
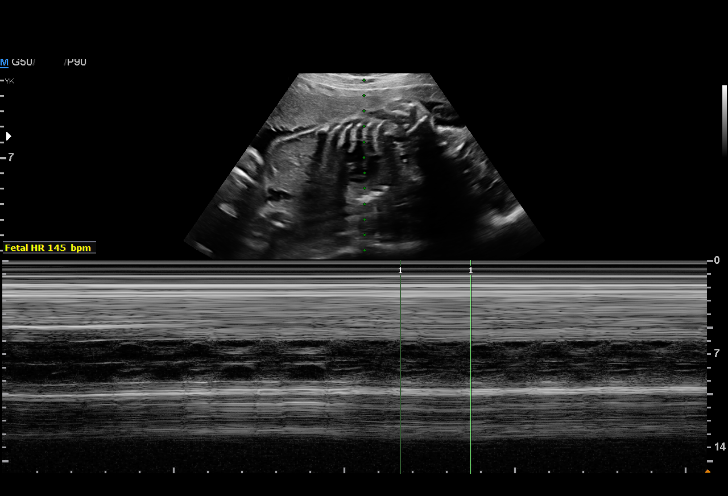
[im 40/47]
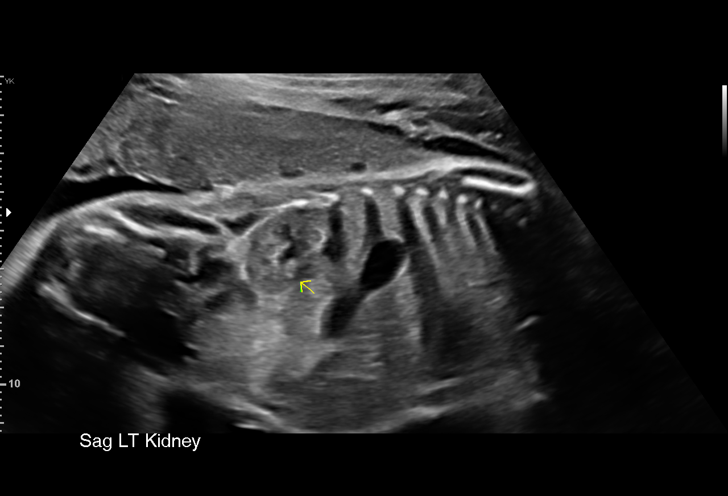
[im 43/47]
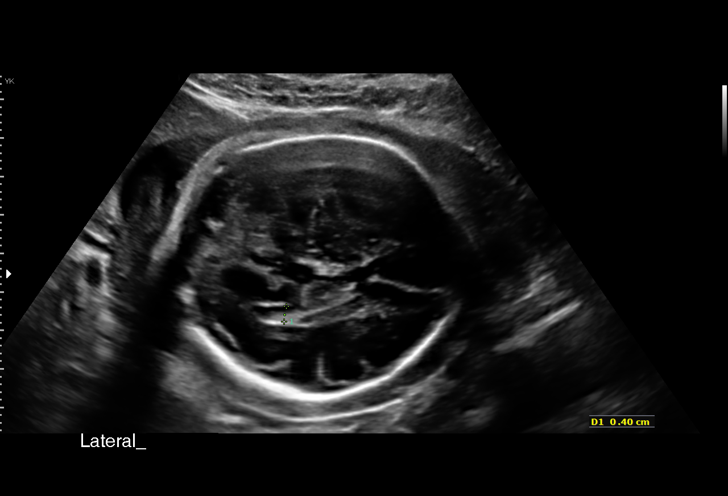
[im 47/47]
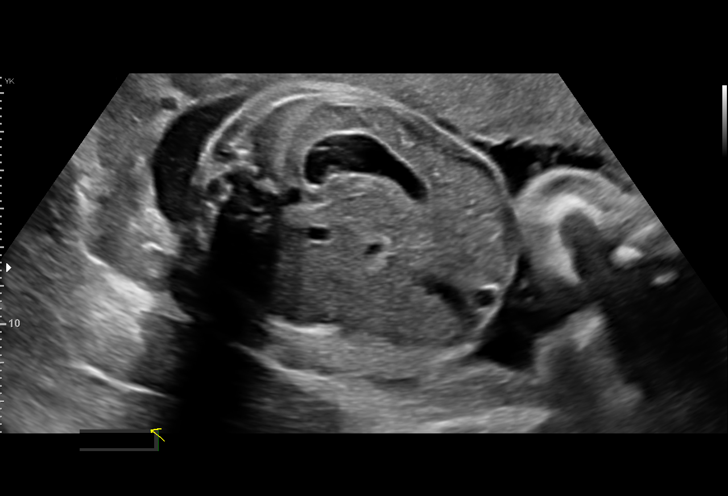

[15 of 28 positions shown; findings below may reference images not displayed]

----------------------------------------------------------------------

 ----------------------------------------------------------------------
Indications

  27 weeks gestation of pregnancy
  Umbilical vein abnormality complicating
  pregnancy
 ----------------------------------------------------------------------
Fetal Evaluation

 Num Of Fetuses:          1
 Fetal Heart Rate(bpm):   146
 Cardiac Activity:        Observed
 Presentation:            Cephalic
 Placenta:                Anterior
 P. Cord Insertion:       Visualized, central

 Amniotic Fluid
 AFI FV:      Within normal limits

 AFI Sum(cm)     %Tile       Largest Pocket(cm)
 19.65           78

 RUQ(cm)       RLQ(cm)        LUQ(cm)        LLQ(cm)

Biometry

 BPD:      67.5   mm     G. Age:  27w 1d         30  %    CI:         72.18  %    70 - 86
                                                          FL/HC:       21.4  %    18.6 -
 HC:      252.8   mm     G. Age:  27w 3d         23  %    HC/AC:       1.04       1.05 -
 AC:        244   mm     G. Age:  28w 5d         78  %    FL/BPD:      80.1  %    71 - 87
 FL:       54.1   mm     G. Age:  28w 4d         71  %    FL/AC:       22.2  %    20 - 24
 Est. FW:    7663   gm    2 lb 11 oz      73  %
Gestational Age

 LMP:            27w 3d       Date:  01/10/18                   EDD:  10/17/18
 U/S Today:      28w 0d                                         EDD:  10/13/18
 Best:           27w 3d    Det. By:  LMP  (01/10/18)            EDD:  10/17/18
Anatomy

 Ventricles:             Appears normal         Kidneys:                Appear normal
 Stomach:                Appears normal, left   Bladder:                Appears normal
                         sided
 Cord Vessels:           Appears normal (3
                         vessel cord)
Cervix Uterus Adnexa

 Cervix
 Length:            3.26  cm.
 Normal appearance by transabdominal scan.
Impression

 Follow up for right persistent umbilical vein. This was not seen
 today.
 Normal interval growth.
Recommendations

 Follow up growth as clinically indicated.

## 2020-12-19 ENCOUNTER — Ambulatory Visit (INDEPENDENT_AMBULATORY_CARE_PROVIDER_SITE_OTHER): Payer: BLUE CROSS/BLUE SHIELD | Admitting: Family Medicine

## 2020-12-19 ENCOUNTER — Other Ambulatory Visit: Payer: Self-pay

## 2020-12-19 ENCOUNTER — Encounter: Payer: Self-pay | Admitting: Family Medicine

## 2020-12-19 VITALS — BP 108/76 | HR 95 | Temp 98.0°F | Ht 68.0 in | Wt 186.0 lb

## 2020-12-19 DIAGNOSIS — J4530 Mild persistent asthma, uncomplicated: Secondary | ICD-10-CM | POA: Diagnosis not present

## 2020-12-19 DIAGNOSIS — F321 Major depressive disorder, single episode, moderate: Secondary | ICD-10-CM | POA: Insufficient documentation

## 2020-12-19 DIAGNOSIS — F411 Generalized anxiety disorder: Secondary | ICD-10-CM | POA: Diagnosis not present

## 2020-12-19 MED ORDER — ALBUTEROL SULFATE HFA 108 (90 BASE) MCG/ACT IN AERS: INHALATION_SPRAY | RESPIRATORY_TRACT | 1 refills | Status: DC

## 2020-12-19 MED ORDER — HYDROXYZINE HCL 25 MG PO TABS: 12.5000 mg | ORAL_TABLET | Freq: Three times a day (TID) | ORAL | 1 refills | Status: DC | PRN

## 2020-12-19 MED ORDER — CITALOPRAM HYDROBROMIDE 20 MG PO TABS: 20.0000 mg | ORAL_TABLET | Freq: Every day | ORAL | 3 refills | Status: DC
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
## 2020-12-19 NOTE — Patient Instructions (Addendum)
Please consider counseling. Contact [phone] to schedule an appointment or inquire about cost/insurance coverage.  Aim to do some physical exertion for 150 minutes per week. This is typically divided into 5 days per week, 30 minutes per day. The activity should be enough to get your heart rate up. Anything is better than nothing if you have time constraints.  Let me know if you are planning to get pregnant.   Celexa: Take 1/2 tab daily for the 1st 2 weeks and then a full tab.   Coping skills Choose 5 that work for you: Take a deep breath Count to 20 Read a book Do a puzzle Meditate Bake Sing Knit Garden Pray Go outside Call a friend Listen to music Take a walk Color Send a note Take a bath Watch a movie Be alone in a quiet place Pet an animal Visit a friend Journal Exercise Stretch

## 2020-12-20 ENCOUNTER — Telehealth: Payer: Self-pay | Admitting: Family Medicine

## 2020-12-20 NOTE — Telephone Encounter (Signed)
Initiated PA for Hydroxyzine HCL 25 MG Tablet on covermymeds. Patient OA#[id number] Awaiting response.

## 2021-01-02 NOTE — Telephone Encounter (Signed)
Received approval from 12/30/2020 through 12/30/2021. Patient/pharmacy informed of approval.

## 2021-02-06 ENCOUNTER — Ambulatory Visit: Payer: BLUE CROSS/BLUE SHIELD | Admitting: Family Medicine

## 2022-01-06 ENCOUNTER — Other Ambulatory Visit: Payer: Self-pay | Admitting: Family Medicine

## 2022-01-06 DIAGNOSIS — J4530 Mild persistent asthma, uncomplicated: Secondary | ICD-10-CM

## 2022-12-04 ENCOUNTER — Emergency Department (HOSPITAL_BASED_OUTPATIENT_CLINIC_OR_DEPARTMENT_OTHER)
Admission: EM | Admit: 2022-12-04 | Discharge: 2022-12-04 | Disposition: A | Discharge status: Home / Self Care | Payer: BLUE CROSS/BLUE SHIELD | Attending: Emergency Medicine | Admitting: Emergency Medicine

## 2022-12-04 ENCOUNTER — Emergency Department (HOSPITAL_BASED_OUTPATIENT_CLINIC_OR_DEPARTMENT_OTHER): Payer: BLUE CROSS/BLUE SHIELD

## 2022-12-04 ENCOUNTER — Other Ambulatory Visit: Payer: Self-pay

## 2022-12-04 ENCOUNTER — Encounter (HOSPITAL_BASED_OUTPATIENT_CLINIC_OR_DEPARTMENT_OTHER): Payer: Self-pay | Admitting: Radiology

## 2022-12-04 DIAGNOSIS — M79642 Pain in left hand: Secondary | ICD-10-CM | POA: Insufficient documentation

## 2022-12-04 DIAGNOSIS — I69398 Other sequelae of cerebral infarction: Secondary | ICD-10-CM

## 2022-12-04 DIAGNOSIS — R109 Unspecified abdominal pain: Secondary | ICD-10-CM | POA: Diagnosis present

## 2022-12-04 DIAGNOSIS — Z91040 Latex allergy status: Secondary | ICD-10-CM | POA: Insufficient documentation

## 2022-12-04 DIAGNOSIS — M940 Chondrocostal junction syndrome [Tietze]: Secondary | ICD-10-CM | POA: Diagnosis not present

## 2022-12-04 LAB — URINALYSIS, ROUTINE W REFLEX MICROSCOPIC
Bilirubin Urine: NEGATIVE
Glucose, UA: NEGATIVE mg/dL
Ketones, ur: NEGATIVE mg/dL
Leukocytes,Ua: NEGATIVE
Nitrite: NEGATIVE
Protein, ur: NEGATIVE mg/dL
Specific Gravity, Urine: >=1.03 (ref 1.005–1.030)
pH: 5.5 (ref 5.0–8.0)

## 2022-12-04 LAB — HEPATIC FUNCTION PANEL
ALT: 21 U/L (ref 0–44)
AST: 22 U/L (ref 15–41)
Albumin: 4.2 g/dL (ref 3.5–5.0)
Alkaline Phosphatase: 61 U/L (ref 38–126)
Bilirubin, Direct: <0.1 mg/dL (ref 0.0–0.2)
Total Bilirubin: 0.4 mg/dL (ref 0.3–1.2)
Total Protein: 7.8 g/dL (ref 6.5–8.1)

## 2022-12-04 LAB — CBC
HCT: 37.4 % (ref 36.0–46.0)
Hemoglobin: 12.5 g/dL (ref 12.0–15.0)
MCH: 28.9 pg (ref 26.0–34.0)
MCHC: 33.4 g/dL (ref 30.0–36.0)
MCV: 86.6 fL (ref 80.0–100.0)
Platelets: 267 10*3/uL (ref 150–400)
RBC: 4.32 MIL/uL (ref 3.87–5.11)
RDW: 13.2 % (ref 11.5–15.5)
WBC: 9.3 10*3/uL (ref 4.0–10.5)
nRBC: 0 % (ref 0.0–0.2)

## 2022-12-04 LAB — BASIC METABOLIC PANEL
Anion gap: 9 (ref 5–15)
BUN: 19 mg/dL (ref 6–20)
CO2: 22 mmol/L (ref 22–32)
Calcium: 9.1 mg/dL (ref 8.9–10.3)
Chloride: 104 mmol/L (ref 98–111)
Creatinine, Ser: 1.05 mg/dL — ABNORMAL HIGH (ref 0.44–1.00)
GFR, Estimated: >60 mL/min (ref >60)
Glucose, Bld: 110 mg/dL — ABNORMAL HIGH (ref 70–99)
Potassium: 3.8 mmol/L (ref 3.5–5.1)
Sodium: 135 mmol/L (ref 135–145)

## 2022-12-04 LAB — LIPASE, BLOOD: Lipase: 48 U/L (ref 11–51)

## 2022-12-04 LAB — URINALYSIS, MICROSCOPIC (REFLEX)

## 2022-12-04 LAB — PREGNANCY, URINE: Preg Test, Ur: NEGATIVE

## 2022-12-04 MED ORDER — METHOCARBAMOL 500 MG PO TABS
500.0000 mg | ORAL_TABLET | Freq: Once | ORAL | Status: AC
  Administered 2022-12-04: 500 mg via ORAL
  Filled 2022-12-04: qty 1

## 2022-12-04 MED ORDER — METHOCARBAMOL 500 MG PO TABS: 500.0000 mg | ORAL_TABLET | Freq: Three times a day (TID) | ORAL | 0 refills | Status: DC | PRN

## 2022-12-04 NOTE — ED Triage Notes (Signed)
Pt states she began having pain in her left flank area that wraps around into her abd that started around 10am today. Pt denies any urinary symptoms and believes this to be a muscle spasm.  No exacerbating or alleviating factors. Pt states that the pain comes and goes like a spasm.

## 2022-12-04 NOTE — ED Provider Notes (Signed)
[facility] Provider Note   CSN: [id number] Arrival date & time: 12/04/22  1559     History  Chief Complaint  Patient presents with   Flank Pain    Gina Harrison is a 32 y.o. female.  Patient is a 32 year old female with no significant past medical history presenting for complaints of flank pain.  Patient admits to left-sided flank pain and CVA tenderness that is intermittent, spontaneous, sharp, and severe.  Denies hematuria, increased frequency, urgency, or difficulty urinating.  Denies previous history of nephrolithiasis.  Denies current pregnancy.  Denies feelings of indigestion or heartburn.  The history is provided by the patient. No language interpreter was used.  Flank Pain Pertinent negatives include no chest pain, no abdominal pain and no shortness of breath.       Home Medications Prior to Admission medications   Medication Sig Start Date End Date Taking? Authorizing Provider  methocarbamol (ROBAXIN) 500 MG tablet Take 1 tablet (500 mg total) by mouth every 8 (eight) hours as needed for muscle spasms (pain). 12/04/22  Yes Edwin Dada P, DO  albuterol (VENTOLIN HFA) 108 (90 Base) MCG/ACT inhaler INHALE 2 PUFFS BY MOUTH EVERY 4 HOURS AS NEEDED FOR COUGH OR WHEEZING. 01/07/22   Sharlene Dory, DO  citalopram (CELEXA) 20 MG tablet Take 1 tablet (20 mg total) by mouth daily. 12/19/20   Sharlene Dory, DO  EPINEPHrine (EPIPEN 2-PAK) 0.3 mg/0.3 mL IJ SOAJ injection Use as directed for severe allergic reactions Patient not taking: No sig reported 03/23/18   Leftwich-Kirby, Misty Stanley A, CNM  hydrOXYzine (ATARAX/VISTARIL) 25 MG tablet Take 0.5-3 tablets (12.5-75 mg total) by mouth 3 (three) times daily as needed for anxiety. 12/19/20   Sharlene Dory, DO      Allergies    Apple juice, Carrot oil, Celery oil, Kiwi extract, Latex, Peach [prunus persica], and Peanut-containing drug products    Review of  Systems   Review of Systems  Constitutional:  Negative for chills and fever.  HENT:  Negative for ear pain and sore throat.   Eyes:  Negative for pain and visual disturbance.  Respiratory:  Negative for cough and shortness of breath.   Cardiovascular:  Negative for chest pain and palpitations.  Gastrointestinal:  Negative for abdominal pain and vomiting.  Genitourinary:  Positive for flank pain. Negative for dysuria and hematuria.  Musculoskeletal:  Negative for arthralgias and back pain.  Skin:  Negative for color change and rash.  Neurological:  Negative for seizures and syncope.  All other systems reviewed and are negative.   Physical Exam Updated Vital Signs BP 120/74   Pulse 88   Temp 98.1 F (36.7 C) (Oral)   Resp 14   Ht 5\' 8"  (1.727 m)   Wt 93.1 kg   SpO2 100%   BMI 31.22 kg/m  Physical Exam Vitals and nursing note reviewed.  Constitutional:      General: She is not in acute distress.    Appearance: She is well-developed.  HENT:     Head: Normocephalic and atraumatic.  Eyes:     Conjunctiva/sclera: Conjunctivae normal.  Cardiovascular:     Rate and Rhythm: Normal rate and regular rhythm.     Heart sounds: No murmur heard. Pulmonary:     Effort: Pulmonary effort is normal. No respiratory distress.     Breath sounds: Normal breath sounds.  Abdominal:     Palpations: Abdomen is soft.     Tenderness: There is  no abdominal tenderness.  Musculoskeletal:        General: No swelling.     Cervical back: Neck supple.  Skin:    General: Skin is warm and dry.     Capillary Refill: Capillary refill takes less than 2 seconds.  Neurological:     Mental Status: She is alert.  Psychiatric:        Mood and Affect: Mood normal.     ED Results / Procedures / Treatments   Labs (all labs ordered are listed, but only abnormal results are displayed) Labs Reviewed  URINALYSIS, ROUTINE W REFLEX MICROSCOPIC - Abnormal; Notable for the following components:      Result  Value   APPearance CLOUDY (*)    Hgb urine dipstick MODERATE (*)    All other components within normal limits  BASIC METABOLIC PANEL - Abnormal; Notable for the following components:   Glucose, Bld 110 (*)    Creatinine, Ser 1.05 (*)    All other components within normal limits  URINALYSIS, MICROSCOPIC (REFLEX) - Abnormal; Notable for the following components:   Bacteria, UA FEW (*)    All other components within normal limits  PREGNANCY, URINE  CBC  HEPATIC FUNCTION PANEL  LIPASE, BLOOD    EKG None  Radiology CT Renal Stone Study  Result Date: 12/04/2022 CLINICAL DATA:  Acute left flank pain. EXAM: CT ABDOMEN AND PELVIS WITHOUT CONTRAST TECHNIQUE: Multidetector CT imaging of the abdomen and pelvis was performed following the standard protocol without IV contrast. RADIATION DOSE REDUCTION: This exam was performed according to the departmental dose-optimization program which includes automated exposure control, adjustment of the mA and/or kV according to patient size and/or use of iterative reconstruction technique. COMPARISON:  None Available. FINDINGS: Lower chest: No acute abnormality. Hepatobiliary: Cholelithiasis. No biliary dilatation. Liver is unremarkable on these unenhanced images. Pancreas: Unremarkable. No pancreatic ductal dilatation or surrounding inflammatory changes. Spleen: Normal in size without focal abnormality. Adrenals/Urinary Tract: Adrenal glands are unremarkable. Kidneys are normal, without renal calculi, focal lesion, or hydronephrosis. Bladder is unremarkable. Stomach/Bowel: Stomach is within normal limits. Appendix appears normal. No evidence of bowel wall thickening, distention, or inflammatory changes. Vascular/Lymphatic: No significant vascular findings are present. No enlarged abdominal or pelvic lymph nodes. Reproductive: Intrauterine device is noted. No adnexal abnormality is noted. Other: No abdominal wall hernia or abnormality. No abdominopelvic ascites.  Musculoskeletal: No acute or significant osseous findings. IMPRESSION: Cholelithiasis. Intrauterine device is noted. No other abnormality is noted in abdomen or pelvis. Electronically Signed   By: Lupita Raider M.D.   On: 12/04/2022 18:56    Procedures Procedures    Medications Ordered in ED Medications  methocarbamol (ROBAXIN) tablet 500 mg (500 mg Oral Given 12/04/22 1943)    ED Course/ Medical Decision Making/ A&P                                 Medical Decision Making Amount and/or Complexity of Data Reviewed Labs: ordered. Radiology: ordered.  Risk Prescription drug management.   32 year old female with no significant past medical history presenting for complaints of flank pain. Pt is Aox3, no acute distress, afebrile, with stable vitals. Physical exam demonstrates reproducable left CVA pain. No ecchymosis or skin changes. Concern for costochondritis but will get labs/urine to rule out other pathology.   UA demonstrates hematuria. Possibly from IUD. Will get CT renal study to r/o stone pathology. CT renal study demonstrates no acute process.  UA demonstrates no UTI-no pyelonephritis. Patient does have cholelithiasis but pain is on the left side.. no RUQ pain on exam. Symptoms likely secondary to costochondritis. Medication given for pain control. Safe for DC home at this time.   Patient in no distress and overall condition improved here in the ED. Detailed discussions were had with the patient regarding current findings, and need for close f/u with PCP or on call doctor. The patient has been instructed to return immediately if the symptoms worsen in any way for re-evaluation. Patient verbalized understanding and is in agreement with current care plan. All questions answered prior to discharge.         Final Clinical Impression(s) / ED Diagnoses Final diagnoses:  Pain of left hand due to and not concurrent with cerebrovascular accident (CVA)  Costochondritis    Rx / DC  Orders ED Discharge Orders          Ordered    methocarbamol (ROBAXIN) 500 MG tablet  Every 8 hours PRN        12/04/22 1935              Franne Forts, DO 12/09/22 1524

## 2023-06-25 ENCOUNTER — Ambulatory Visit (INDEPENDENT_AMBULATORY_CARE_PROVIDER_SITE_OTHER): Payer: No Typology Code available for payment source | Admitting: Family Medicine

## 2023-06-25 ENCOUNTER — Encounter: Payer: Self-pay | Admitting: Family Medicine

## 2023-06-25 VITALS — BP 110/80 | HR 75 | Temp 98.0°F | Resp 16 | Ht 68.0 in | Wt 207.8 lb

## 2023-06-25 DIAGNOSIS — Z0000 Encounter for general adult medical examination without abnormal findings: Secondary | ICD-10-CM

## 2023-06-25 DIAGNOSIS — J4530 Mild persistent asthma, uncomplicated: Secondary | ICD-10-CM | POA: Diagnosis not present

## 2023-06-25 DIAGNOSIS — Z87892 Personal history of anaphylaxis: Secondary | ICD-10-CM

## 2023-06-25 DIAGNOSIS — E669 Obesity, unspecified: Secondary | ICD-10-CM | POA: Insufficient documentation

## 2023-06-25 MED ORDER — SEMAGLUTIDE-WEIGHT MANAGEMENT 1.7 MG/0.75ML ~~LOC~~ SOAJ
1.7000 mg | SUBCUTANEOUS | 0 refills | Status: AC
Start: 2023-09-20 — End: 2023-10-18

## 2023-06-25 MED ORDER — SEMAGLUTIDE-WEIGHT MANAGEMENT 0.5 MG/0.5ML ~~LOC~~ SOAJ
0.5000 mg | SUBCUTANEOUS | 0 refills | Status: AC
Start: 2023-07-24 — End: 2023-08-21

## 2023-06-25 MED ORDER — ALBUTEROL SULFATE HFA 108 (90 BASE) MCG/ACT IN AERS: INHALATION_SPRAY | RESPIRATORY_TRACT | 0 refills | Status: AC

## 2023-06-25 MED ORDER — SEMAGLUTIDE-WEIGHT MANAGEMENT 0.25 MG/0.5ML ~~LOC~~ SOAJ
0.2500 mg | SUBCUTANEOUS | 0 refills | Status: AC
Start: 2023-06-25 — End: 2023-07-23

## 2023-06-25 MED ORDER — EPINEPHRINE 0.3 MG/0.3ML IJ SOAJ: INTRAMUSCULAR | 1 refills | Status: AC

## 2023-06-25 MED ORDER — SEMAGLUTIDE-WEIGHT MANAGEMENT 2.4 MG/0.75ML ~~LOC~~ SOAJ
2.4000 mg | SUBCUTANEOUS | 0 refills | Status: AC
Start: 2023-10-19 — End: 2023-11-16

## 2023-06-25 MED ORDER — SEMAGLUTIDE-WEIGHT MANAGEMENT 1 MG/0.5ML ~~LOC~~ SOAJ
1.0000 mg | SUBCUTANEOUS | 0 refills | Status: AC
Start: 2023-08-22 — End: 2023-09-19

## 2023-06-25 NOTE — Progress Notes (Signed)
 Chief Complaint  Patient presents with   Follow-up    Follow Up       Well Woman Leonela C Little Luretha Rued is here for a complete physical.   Her last physical was >1 year ago.  Current diet: in general, a "poor" diet. Current exercise: none. Fatigue out of ordinary? No Seatbelt? Yes Advanced directive? No  Health Maintenance Pap/HPV- Yes Tetanus- Yes HIV screening- Yes Hep C screening- Yes  Obesity Patient has gained over 20 pounds in the last couple years.  Diet/exercise as above.  She has never been through a supervised weight loss program.  She seems to eat when she is stressed out and describes herself as an emotional eater.  She has never been on medication to help lose weight before.  She is not taking anything right now currently.  Past Medical History:  Diagnosis Date   Angio-edema    Peanuts   Depression    Mild persistent asthma without complication 09/03/2016     Past Surgical History:  Procedure Laterality Date   TONSILLECTOMY     TYMPANOSTOMY TUBE PLACEMENT      Medications  Patient takes no medications routinely.   Allergies Allergies  Allergen Reactions   Apple Juice    Carrot Oil    Celery Oil    Kiwi Extract    Latex Itching   Peach [Prunus Persica]    Peanut-Containing Drug Products     Tree nuts    Review of Systems: Constitutional:  no unexpected weight changes Eye:  no recent significant change in vision Ear/Nose/Mouth/Throat:  Ears:  no tinnitus or vertigo and no recent change in hearing Nose/Mouth/Throat:  no complaints of nasal congestion, no sore throat Cardiovascular: no chest pain Respiratory:  no cough and no shortness of breath Gastrointestinal:  no abdominal pain, no change in bowel habits GU:  Female: negative for dysuria or pelvic pain Musculoskeletal/Extremities:  no pain of the joints Integumentary (Skin/Breast):  no abnormal skin lesions reported Neurologic:  no headaches Endocrine:  denies  fatigue Hematologic/Lymphatic:  No areas of easy bleeding  Exam BP 110/80 (BP Location: Left Arm, Patient Position: Sitting)   Pulse 75   Temp 98 F (36.7 C) (Oral)   Resp 16   Ht 5\' 8"  (1.727 m)   Wt 207 lb 12.8 oz (94.3 kg)   SpO2 98%   BMI 31.60 kg/m  General:  well developed, well nourished, in no apparent distress Skin:  no significant moles, warts, or growths Head:  no masses, lesions, or tenderness Eyes:  pupils equal and round, sclera anicteric without injection Ears:  canals without lesions, TMs shiny without retraction, no obvious effusion, no erythema Nose:  nares patent, mucosa normal, and no drainage  Throat/Pharynx:  lips and gingiva without lesion; tongue and uvula midline; non-inflamed pharynx; no exudates or postnasal drainage Neck: neck supple without adenopathy, thyromegaly, or masses Lungs:  clear to auscultation, breath sounds equal bilaterally, no respiratory distress Cardio:  regular rate and rhythm, no bruits, no LE edema Abdomen:  abdomen soft, nontender; bowel sounds normal; no masses or organomegaly Genital: Defer to GYN Musculoskeletal:  symmetrical muscle groups noted without atrophy or deformity Extremities:  no clubbing, cyanosis, or edema, no deformities, no skin discoloration Neuro:  gait normal; deep tendon reflexes normal and symmetric Psych: well oriented with normal range of affect and appropriate judgment/insight  Assessment and Plan  Well adult exam  Mild persistent asthma without complication - Plan: albuterol (VENTOLIN HFA) 108 (90 Base) MCG/ACT inhaler  Obesity (BMI 30-39.9) - Plan: Semaglutide-Weight Management 0.25 MG/0.5ML SOAJ, Semaglutide-Weight Management 0.5 MG/0.5ML SOAJ, Semaglutide-Weight Management 1 MG/0.5ML SOAJ, Semaglutide-Weight Management 1.7 MG/0.75ML SOAJ, Semaglutide-Weight Management 2.4 MG/0.75ML SOAJ  History of anaphylaxis - Plan: EPINEPHrine (EPIPEN 2-PAK) 0.3 mg/0.3 mL IJ SOAJ injection   Well 33 y.o.  female. Obesity: Chronic, uncontrolled.  Start Wegovy 0.25 mg weekly and titrate up.  Counseled on diet and exercise.  She will send [place] a message if this is not covered by insurance.  Could try Zepbound.  I think she would benefit from Wellbutrin for emotional eating.  If she is able to get an injectable, I will see her in 2 months.  Otherwise I will see her in 1 month after starting Wellbutrin.  Counseling information provided. Other orders as above. Advanced directive form provided today.  Offered to the pneumonia vaccine given her mild intermittent asthma.  She politely declined.  She will let us know if she changes her mind. The patient voiced understanding and agreement to the plan.  Jilda Roche [place], DO 06/25/23 3:48 PM

## 2023-06-25 NOTE — Patient Instructions (Addendum)
 You are due for your pelvic exam 01/10/25.  Call Center for [facility] at [facility] at [phone] for an appointment.  They are located at [address], [address], [place], [place], [address] (right across the hall from our office).  Give [place] 2-3 business days to get the results of your labs back.   Keep the diet clean and stay active.  Please get me a copy of your advanced directive form at your convenience.   Let me know if there are cost issues.   Get your pneumonia vaccination at the pharmacy if you change your mind.   Aim to do some physical exertion for 150 minutes per week. This is typically divided into 5 days per week, 30 minutes per day. The activity should be enough to get your heart rate up. Anything is better than nothing if you have time constraints.  Please consider counseling. Contact [phone] to schedule an appointment or inquire about cost/insurance coverage.  [facility] located at [address], [place], [place].  Phone number = [phone].  Dr. Regan Lemming - Adult Psychiatry.    [facility] located at [address] [place], [place], [place]. Phone number = [phone].   [facility] located at [address], [place], [place].  Phone number = [phone].   [facility] located at [address] [place], [place], [place].  Phone number = [phone].  Let us know if you need anything.

## 2023-06-26 ENCOUNTER — Encounter: Payer: Self-pay | Admitting: Family Medicine

## 2023-06-26 LAB — LIPID PANEL
Cholesterol: 162 mg/dL (ref 0–200)
HDL: 40.6 mg/dL (ref >39.00)
LDL Cholesterol: 94 mg/dL (ref 0–99)
NonHDL: 121.8
Total CHOL/HDL Ratio: 4
Triglycerides: 138 mg/dL (ref 0.0–149.0)
VLDL: 27.6 mg/dL (ref 0.0–40.0)

## 2023-06-26 LAB — COMPREHENSIVE METABOLIC PANEL
ALT: 16 U/L (ref 0–35)
AST: 13 U/L (ref 0–37)
Albumin: 4.2 g/dL (ref 3.5–5.2)
Alkaline Phosphatase: 61 U/L (ref 39–117)
BUN: 11 mg/dL (ref 6–23)
CO2: 25 meq/L (ref 19–32)
Calcium: 9.2 mg/dL (ref 8.4–10.5)
Chloride: 105 meq/L (ref 96–112)
Creatinine, Ser: 0.98 mg/dL (ref 0.40–1.20)
GFR: 75 mL/min (ref >60.00)
Glucose, Bld: 93 mg/dL (ref 70–99)
Potassium: 3.8 meq/L (ref 3.5–5.1)
Sodium: 138 meq/L (ref 135–145)
Total Bilirubin: 0.3 mg/dL (ref 0.2–1.2)
Total Protein: 7.6 g/dL (ref 6.0–8.3)

## 2023-06-26 LAB — CBC
HCT: 38.6 % (ref 36.0–46.0)
Hemoglobin: 12.5 g/dL (ref 12.0–15.0)
MCHC: 32.3 g/dL (ref 30.0–36.0)
MCV: 89.9 fl (ref 78.0–100.0)
Platelets: 251 10*3/uL (ref 150.0–400.0)
RBC: 4.29 Mil/uL (ref 3.87–5.11)
RDW: 13.7 % (ref 11.5–15.5)
WBC: 7.7 10*3/uL (ref 4.0–10.5)

## 2023-06-27 ENCOUNTER — Telehealth: Payer: Self-pay | Admitting: Family Medicine

## 2023-06-27 NOTE — Telephone Encounter (Signed)
 Pt requesting update on PA for Wegovy, routing to clinic for review.  Copied from CRM [id number]. Topic: Clinical - Prescription Issue >> Jun 27, 2023  3:07 PM Denese Killings wrote: Reason for CRM: Patient is calling to see if we received a Prior Authorization from pharmacy for [facility] medication.

## 2023-06-30 ENCOUNTER — Encounter: Payer: Self-pay | Admitting: Family Medicine

## 2023-07-01 NOTE — Telephone Encounter (Signed)
 Patient was advised that her message was send to the Prior authorization team to be completed.

## 2023-07-02 ENCOUNTER — Other Ambulatory Visit (HOSPITAL_COMMUNITY): Payer: Self-pay

## 2023-07-02 ENCOUNTER — Telehealth: Payer: Self-pay | Admitting: Pharmacy Technician

## 2023-07-02 NOTE — Telephone Encounter (Signed)
 Pharmacy Patient Advocate Encounter   Received notification from Patient Pharmacy that prior authorization for [facility] is required/requested.   Insurance verification completed.   The patient is insured through [facility] .   Per test claim:

## 2023-07-02 NOTE — Telephone Encounter (Signed)
 Patient would like call back r/t prior authorization for [facility]

## 2023-07-07 ENCOUNTER — Encounter: Payer: Self-pay | Admitting: Family Medicine

## 2023-07-10 ENCOUNTER — Telehealth: Payer: Self-pay

## 2023-07-10 NOTE — Telephone Encounter (Signed)
 Clinical questions have been answered and PA submitted. PA currently Pending.

## 2023-07-10 NOTE — Telephone Encounter (Signed)
 Pharmacy Patient Advocate Encounter   Received notification from CoverMyMeds that prior authorization for Wegovy 0.25MG /0.5ML auto-injectors is required/requested.   Insurance verification completed.   The patient is insured through [employer] .   Per test claim: PA required; PA started via CoverMyMeds. KEY [id number] . Waiting for clinical questions to populate.

## 2023-07-10 NOTE — Telephone Encounter (Signed)
 Pharmacy Patient Advocate Encounter  Received notification from [facility] that Prior Authorization for [employer] 0.25MG /0.5ML auto-injectors has been DENIED.  See denial reason below. No denial letter attached in CMM. Will attach denial letter to Media tab once received.   PA #/Case ID/Reference #: [id number]   Denied. This drug is not covered because drugs used to treat weight gain are not covered by [employer] [facility]. Please refer to your Member Handbook for details.

## 2023-07-28 ENCOUNTER — Encounter: Payer: Self-pay | Admitting: Family Medicine

## 2023-11-18 ENCOUNTER — Encounter: Payer: Self-pay | Admitting: Family Medicine
